# Patient Record
Sex: Female | Born: 1965
Health system: Southern US, Community
[De-identification: ages and names within clinical notes are randomized; demographics above are authoritative.]

## PROBLEM LIST (undated history)

## (undated) DIAGNOSIS — I10 Essential (primary) hypertension: Secondary | ICD-10-CM

## (undated) HISTORY — PX: TUBAL LIGATION: SHX77

---

## 1997-03-21 ENCOUNTER — Ambulatory Visit (HOSPITAL_COMMUNITY): Admission: RE | Admit: 1997-03-21 | Discharge: 1997-03-21 | Payer: Self-pay | Admitting: Obstetrics and Gynecology

## 1999-01-15 ENCOUNTER — Ambulatory Visit (HOSPITAL_COMMUNITY): Admission: RE | Admit: 1999-01-15 | Discharge: 1999-01-15 | Payer: Self-pay | Admitting: Obstetrics and Gynecology

## 2001-01-03 ENCOUNTER — Other Ambulatory Visit: Admission: RE | Admit: 2001-01-03 | Discharge: 2001-01-03 | Payer: Self-pay | Admitting: Obstetrics and Gynecology

## 2002-12-28 ENCOUNTER — Encounter: Admission: RE | Admit: 2002-12-28 | Discharge: 2002-12-28 | Payer: Self-pay | Admitting: *Deleted

## 2004-03-12 ENCOUNTER — Encounter (INDEPENDENT_AMBULATORY_CARE_PROVIDER_SITE_OTHER): Payer: Self-pay | Admitting: Specialist

## 2004-03-12 ENCOUNTER — Ambulatory Visit (HOSPITAL_BASED_OUTPATIENT_CLINIC_OR_DEPARTMENT_OTHER): Admission: RE | Admit: 2004-03-12 | Discharge: 2004-03-12 | Payer: Self-pay | Admitting: Obstetrics and Gynecology

## 2004-03-12 ENCOUNTER — Ambulatory Visit (HOSPITAL_COMMUNITY): Admission: RE | Admit: 2004-03-12 | Discharge: 2004-03-12 | Payer: Self-pay | Admitting: Obstetrics and Gynecology

## 2006-06-04 ENCOUNTER — Emergency Department (HOSPITAL_COMMUNITY): Admission: EM | Admit: 2006-06-04 | Discharge: 2006-06-04 | Payer: Self-pay | Admitting: Emergency Medicine

## 2008-08-29 ENCOUNTER — Emergency Department (HOSPITAL_BASED_OUTPATIENT_CLINIC_OR_DEPARTMENT_OTHER): Admission: EM | Admit: 2008-08-29 | Discharge: 2008-08-30 | Payer: Self-pay | Admitting: Emergency Medicine

## 2008-08-29 ENCOUNTER — Ambulatory Visit: Payer: Self-pay | Admitting: Diagnostic Radiology

## 2010-05-03 LAB — PROTEIN, CSF: Total  Protein, CSF: 33 mg/dL (ref 15–45)

## 2010-05-03 LAB — CSF CULTURE W GRAM STAIN
Culture: NO GROWTH
Gram Stain: NONE SEEN

## 2010-05-03 LAB — CBC
HCT: 38.8 % (ref 36.0–46.0)
Hemoglobin: 13.5 g/dL (ref 12.0–15.0)
MCHC: 34.7 g/dL (ref 30.0–36.0)
MCV: 97.4 fL (ref 78.0–100.0)
Platelets: 222 10*3/uL (ref 150–400)
RBC: 3.98 MIL/uL (ref 3.87–5.11)
RDW: 12.2 % (ref 11.5–15.5)
WBC: 6.4 10*3/uL (ref 4.0–10.5)

## 2010-05-03 LAB — GRAM STAIN: Gram Stain: NONE SEEN

## 2010-05-03 LAB — CSF CELL COUNT WITH DIFFERENTIAL
RBC Count, CSF: 0 /mm3
RBC Count, CSF: 1010 /mm3 — ABNORMAL HIGH
Tube #: 1
Tube #: 4
WBC, CSF: 2 /mm3 (ref 0–5)
WBC, CSF: 2 /mm3 (ref 0–5)

## 2010-05-03 LAB — COMPREHENSIVE METABOLIC PANEL
ALT: 22 U/L (ref 0–35)
AST: 22 U/L (ref 0–37)
Albumin: 4.2 g/dL (ref 3.5–5.2)
Alkaline Phosphatase: 48 U/L (ref 39–117)
BUN: 11 mg/dL (ref 6–23)
CO2: 26 mEq/L (ref 19–32)
Calcium: 9.9 mg/dL (ref 8.4–10.5)
Chloride: 105 mEq/L (ref 96–112)
Creatinine, Ser: 0.7 mg/dL (ref 0.4–1.2)
GFR calc Af Amer: >60 mL/min (ref >60)
GFR calc non Af Amer: >60 mL/min (ref >60)
Glucose, Bld: 81 mg/dL (ref 70–99)
Potassium: 3.8 mEq/L (ref 3.5–5.1)
Sodium: 142 mEq/L (ref 135–145)
Total Bilirubin: 0.2 mg/dL — ABNORMAL LOW (ref 0.3–1.2)
Total Protein: 7.1 g/dL (ref 6.0–8.3)

## 2010-05-03 LAB — DIFFERENTIAL
Basophils Absolute: 0 10*3/uL (ref 0.0–0.1)
Basophils Relative: 0 % (ref 0–1)
Eosinophils Absolute: 0.1 10*3/uL (ref 0.0–0.7)
Eosinophils Relative: 2 % (ref 0–5)
Lymphocytes Relative: 44 % (ref 12–46)
Lymphs Abs: 2.8 10*3/uL (ref 0.7–4.0)
Monocytes Absolute: 0.6 10*3/uL (ref 0.1–1.0)
Monocytes Relative: 9 % (ref 3–12)
Neutro Abs: 2.9 10*3/uL (ref 1.7–7.7)
Neutrophils Relative %: 45 % (ref 43–77)

## 2010-05-03 LAB — GLUCOSE, CSF: Glucose, CSF: 51 mg/dL (ref 43–76)

## 2010-06-13 NOTE — Op Note (Signed)
Debbie Bradshaw, Debbie Bradshaw            ACCOUNT NO.:  [id number]   MEDICAL RECORD NO.:  [id number]          PATIENT TYPE:  AMB   LOCATION:  [facility]                         FACILITY:  [facility]   PHYSICIAN:  Sherry A. Dickstein, M.D.DATE OF BIRTH:  07/05/1965   DATE OF PROCEDURE:  03/12/2004  DATE OF DISCHARGE:                                 OPERATIVE REPORT   PREOPERATIVE DIAGNOSES:  Menorrhagia, fibroid uterus.   POSTOPERATIVE DIAGNOSES:  Menorrhagia, fibroid uterus.   PROCEDURE:  Dilatation and curettage, hysteroscopy with hydrotherm  ablation.   SURGEON:  Sherry A. Rosalio Macadamia, M.D.   ANESTHESIA:  General.   INDICATIONS FOR PROCEDURE:  This is a 45 year old G-1, P-1, 0, 0, I woman  who has menstrual periods every month, lasting for five days, with  excessively heavy flow.  The patient has had known fibroids in the past.  The patient had a D&C and hysteroscopy with a resectoscope in 1999, which  improved her heavy bleeding significantly.  Since that time the bleeding has  gotten continuously worse, until this time it is back to where it was.  The  patient has had an ultrasound and a sono-hysterogram which revealed no  significant submucosal fibroid.  Because of this the patient is brought to  the operating room for a D&C, hysteroscopy with hydrotherm ablation, trying  to avoid a hysterectomy.   FINDINGS:  Approximately eight to nine weeks' size anteflexed uterus.  No  adnexal mass.  No significant submucosal fibroid seen.   DESCRIPTION OF PROCEDURE:  The patient is brought into the operating room  and given adequate general anesthesia.  She is placed in the dorsal  lithotomy position.  Her perineum and vagina were washed with Hibiclens.  The patient is draped in a sterile fashion. The speculum was placed within  the vagina.  A paracervical block was administered with 1% Nesacaine.  The  anterior lip of the cervix was grasped with a single-tooth tenaculum.  The  cervix was sounded and  dilated to approximately a #19.  It was felt that the  cervix was adequately dilated to allow a hydrotherm ablation hysteroscope  into the endometrial cavity.  This was placed without difficulty.  Pictures  were obtained.  Both cornu's were visualized.  There was some endometrial  tissue present, but no fibroids seen.  The scope was removed back to the  level of the internal os and the hydrotherm ablation sequence was begun,  including the heating of the fluid, and then a 10-minute fluid irrigation  within the endometrial cavity for therapy.  There was approximately one  minute of cool-down.  Pictures were obtained after the procedure.  The  hysteroscope was removed from the endometrial cavity using a sharp curet.  A  curettage was performed circumferentially with endometrial tissue removed in  sheets.  Adequate hemostasis was present.  All instruments were removed from  the vagina.  The patient was taken out of the dorsal lithotomy position.  She was awakened.  She was moved from the operating room table to a  stretcher, in stable condition.   COMPLICATIONS:  None.   ESTIMATED BLOOD  LOSS:  Was less than 5 mL.   SPECIMENS:  Endometrial curettings.      SAD/MEDQ  D:  03/12/2004  T:  03/12/2004  Job:  [id number]

## 2011-06-30 ENCOUNTER — Encounter (HOSPITAL_BASED_OUTPATIENT_CLINIC_OR_DEPARTMENT_OTHER): Payer: Self-pay | Admitting: *Deleted

## 2011-06-30 ENCOUNTER — Emergency Department (HOSPITAL_BASED_OUTPATIENT_CLINIC_OR_DEPARTMENT_OTHER)
Admission: EM | Admit: 2011-06-30 | Discharge: 2011-06-30 | Disposition: A | Discharge status: Home / Self Care | Payer: 59 | Attending: Emergency Medicine | Admitting: Emergency Medicine

## 2011-06-30 DIAGNOSIS — R51 Headache: Secondary | ICD-10-CM | POA: Insufficient documentation

## 2011-06-30 DIAGNOSIS — R519 Headache, unspecified: Secondary | ICD-10-CM

## 2011-06-30 HISTORY — DX: Essential (primary) hypertension: I10

## 2011-06-30 MED ORDER — OXYCODONE-ACETAMINOPHEN 5-325 MG PO TABS: 2.0000 | ORAL_TABLET | ORAL | Status: AC | PRN

## 2011-06-30 NOTE — ED Notes (Signed)
Pt dx bells palsy x 2 weeks ago, pt states increased in facial pan , completetd  acyclovir and prednisone.

## 2011-06-30 NOTE — ED Provider Notes (Signed)
History     CSN: [id number]  Arrival date & time 06/30/11  1908   First MD Initiated Contact with Patient 06/30/11 1930      Chief Complaint  Patient presents with  . Facial Pain    (Consider location/radiation/quality/duration/timing/severity/associated sxs/prior treatment) HPI Comments: Pt is c/o left sided facial pain near the ear that has been going on for several days:pt states that she was diagnosed with bells palsy and she is finished the steroids which helped a little, but she feels like she needs something more for pain:pt was given hydrocodone yesterday by her pcp, but states that she needs something more  The history is provided by the patient. No language interpreter was used.    Past Medical History  Diagnosis Date  . Hypertension     Past Surgical History  Procedure Date  . Cesarean section   . Tubal ligation     History reviewed. No pertinent family history.  History  Substance Use Topics  . Smoking status: Current Everyday Smoker -- 1.0 packs/day  . Smokeless tobacco: Not on file  . Alcohol Use: No    OB History    Grav Para Term Preterm Abortions TAB SAB Ect Mult Living                  Review of Systems  Constitutional: Negative.   Respiratory: Negative.   Cardiovascular: Negative.     Allergies  Review of patient's allergies indicates no known allergies.  Home Medications   Current Outpatient Rx  Name Route Sig Dispense Refill  . ACYCLOVIR 400 MG PO TABS Oral Take 400 mg by mouth every 4 (four) hours while awake.    Marland Kitchen HYDROCODONE-ACETAMINOPHEN 5-325 MG PO TABS Oral Take 1 tablet by mouth every 6 (six) hours as needed.    Marland Kitchen LISINOPRIL-HYDROCHLOROTHIAZIDE 20-25 MG PO TABS Oral Take 1 tablet by mouth daily.    Marland Kitchen METOPROLOL SUCCINATE ER 100 MG PO TB24 Oral Take 100 mg by mouth daily. Take with or immediately following a meal.      BP 126/76  Pulse 75  Temp(Src) 97.9 F (36.6 C) (Oral)  Resp 16  Ht 5\' 5"  (1.651 m)  Wt 219 lb (99.338  kg)  BMI 36.44 kg/m2  SpO2 100%  Physical Exam  Nursing note and vitals reviewed. Constitutional: She is oriented to person, place, and time. She appears well-developed and well-nourished.  HENT:  Right Ear: External ear normal.  Left Ear: External ear normal.       Pt has a left side facial droop  Cardiovascular: Normal rate and regular rhythm.   Pulmonary/Chest: Effort normal and breath sounds normal.  Neurological: She is alert and oriented to person, place, and time. She exhibits abnormal muscle tone. Coordination normal.    ED Course  Procedures (including critical care time)  Labs Reviewed - No data to display No results found.   1. Facial pain       MDM  Will treat pt symptomatically with percocet:no sign of tooth or dental pain        Teressa Lower, NP 06/30/11 1957

## 2011-07-01 NOTE — ED Provider Notes (Signed)
Medical screening examination/treatment/procedure(s) were performed by non-physician practitioner and as supervising physician I was immediately available for consultation/collaboration.   Tildon Silveria L Zenia Guest, MD 07/01/11 0218 

## 2012-11-09 ENCOUNTER — Encounter (HOSPITAL_COMMUNITY): Payer: Self-pay | Admitting: Emergency Medicine

## 2012-11-09 ENCOUNTER — Emergency Department (HOSPITAL_COMMUNITY)
Admission: EM | Admit: 2012-11-09 | Discharge: 2012-11-09 | Discharge status: Against Medical Advice | Payer: 59 | Attending: Emergency Medicine | Admitting: Emergency Medicine

## 2012-11-09 DIAGNOSIS — R51 Headache: Secondary | ICD-10-CM | POA: Insufficient documentation

## 2012-11-09 DIAGNOSIS — Z79899 Other long term (current) drug therapy: Secondary | ICD-10-CM | POA: Insufficient documentation

## 2012-11-09 DIAGNOSIS — I10 Essential (primary) hypertension: Secondary | ICD-10-CM | POA: Insufficient documentation

## 2012-11-09 DIAGNOSIS — F17200 Nicotine dependence, unspecified, uncomplicated: Secondary | ICD-10-CM | POA: Insufficient documentation

## 2012-11-09 DIAGNOSIS — Z8669 Personal history of other diseases of the nervous system and sense organs: Secondary | ICD-10-CM | POA: Insufficient documentation

## 2012-11-09 NOTE — ED Notes (Signed)
Headache since 1800 today.  Hx of migraine headaches.  She has not taken any med for her headache

## 2012-11-09 NOTE — ED Notes (Signed)
Pt called with no answer

## 2012-11-09 NOTE — ED Notes (Signed)
Pt called with no Answer

## 2015-09-10 DIAGNOSIS — L918 Other hypertrophic disorders of the skin: Secondary | ICD-10-CM | POA: Diagnosis not present

## 2015-10-15 DIAGNOSIS — Z6833 Body mass index (BMI) 33.0-33.9, adult: Secondary | ICD-10-CM | POA: Diagnosis not present

## 2015-10-15 DIAGNOSIS — F17200 Nicotine dependence, unspecified, uncomplicated: Secondary | ICD-10-CM | POA: Diagnosis not present

## 2015-10-15 DIAGNOSIS — Z130 Encounter for screening for diseases of the blood and blood-forming organs and certain disorders involving the immune mechanism: Secondary | ICD-10-CM | POA: Diagnosis not present

## 2015-10-15 DIAGNOSIS — Z1389 Encounter for screening for other disorder: Secondary | ICD-10-CM | POA: Diagnosis not present

## 2015-10-15 DIAGNOSIS — Z01419 Encounter for gynecological examination (general) (routine) without abnormal findings: Secondary | ICD-10-CM | POA: Diagnosis not present

## 2015-10-15 DIAGNOSIS — E8941 Symptomatic postprocedural ovarian failure: Secondary | ICD-10-CM | POA: Diagnosis not present

## 2015-10-22 DIAGNOSIS — I10 Essential (primary) hypertension: Secondary | ICD-10-CM | POA: Diagnosis not present

## 2015-10-22 DIAGNOSIS — Z0000 Encounter for general adult medical examination without abnormal findings: Secondary | ICD-10-CM | POA: Diagnosis not present

## 2015-10-22 DIAGNOSIS — N390 Urinary tract infection, site not specified: Secondary | ICD-10-CM | POA: Diagnosis not present

## 2015-10-22 DIAGNOSIS — R8299 Other abnormal findings in urine: Secondary | ICD-10-CM | POA: Diagnosis not present

## 2015-10-29 DIAGNOSIS — Z1389 Encounter for screening for other disorder: Secondary | ICD-10-CM | POA: Diagnosis not present

## 2015-10-29 DIAGNOSIS — Z780 Asymptomatic menopausal state: Secondary | ICD-10-CM | POA: Diagnosis not present

## 2015-10-29 DIAGNOSIS — E611 Iron deficiency: Secondary | ICD-10-CM | POA: Diagnosis not present

## 2015-10-29 DIAGNOSIS — Z0000 Encounter for general adult medical examination without abnormal findings: Secondary | ICD-10-CM | POA: Diagnosis not present

## 2015-10-29 DIAGNOSIS — I10 Essential (primary) hypertension: Secondary | ICD-10-CM | POA: Diagnosis not present

## 2015-10-29 DIAGNOSIS — F418 Other specified anxiety disorders: Secondary | ICD-10-CM | POA: Diagnosis not present

## 2015-12-11 DIAGNOSIS — Z6837 Body mass index (BMI) 37.0-37.9, adult: Secondary | ICD-10-CM | POA: Diagnosis not present

## 2015-12-11 DIAGNOSIS — H6692 Otitis media, unspecified, left ear: Secondary | ICD-10-CM | POA: Diagnosis not present

## 2015-12-24 DIAGNOSIS — S339XXA Sprain of unspecified parts of lumbar spine and pelvis, initial encounter: Secondary | ICD-10-CM | POA: Diagnosis not present

## 2015-12-26 DIAGNOSIS — Z23 Encounter for immunization: Secondary | ICD-10-CM | POA: Diagnosis not present

## 2015-12-26 DIAGNOSIS — M5416 Radiculopathy, lumbar region: Secondary | ICD-10-CM | POA: Diagnosis not present

## 2015-12-26 DIAGNOSIS — Z6838 Body mass index (BMI) 38.0-38.9, adult: Secondary | ICD-10-CM | POA: Diagnosis not present

## 2016-01-01 ENCOUNTER — Other Ambulatory Visit: Payer: Self-pay | Admitting: Internal Medicine

## 2016-01-01 DIAGNOSIS — M5416 Radiculopathy, lumbar region: Secondary | ICD-10-CM

## 2016-01-08 ENCOUNTER — Other Ambulatory Visit: Payer: Self-pay | Admitting: Internal Medicine

## 2016-01-21 ENCOUNTER — Ambulatory Visit
Admission: RE | Admit: 2016-01-21 | Discharge: 2016-01-21 | Disposition: A | Discharge status: Home / Self Care | Payer: BLUE CROSS/BLUE SHIELD | Source: Ambulatory Visit | Attending: Internal Medicine | Admitting: Internal Medicine

## 2016-01-21 DIAGNOSIS — M5416 Radiculopathy, lumbar region: Secondary | ICD-10-CM

## 2016-01-21 DIAGNOSIS — M5126 Other intervertebral disc displacement, lumbar region: Secondary | ICD-10-CM | POA: Diagnosis not present

## 2016-01-27 HISTORY — PX: SHOULDER ARTHROSCOPY: SHX128

## 2016-02-18 DIAGNOSIS — M4726 Other spondylosis with radiculopathy, lumbar region: Secondary | ICD-10-CM | POA: Diagnosis not present

## 2016-02-18 DIAGNOSIS — I10 Essential (primary) hypertension: Secondary | ICD-10-CM | POA: Diagnosis not present

## 2016-02-18 DIAGNOSIS — Z6832 Body mass index (BMI) 32.0-32.9, adult: Secondary | ICD-10-CM | POA: Diagnosis not present

## 2016-03-04 ENCOUNTER — Ambulatory Visit (INDEPENDENT_AMBULATORY_CARE_PROVIDER_SITE_OTHER): Payer: Self-pay

## 2016-03-04 ENCOUNTER — Ambulatory Visit (INDEPENDENT_AMBULATORY_CARE_PROVIDER_SITE_OTHER): Payer: BLUE CROSS/BLUE SHIELD | Admitting: Orthopedic Surgery

## 2016-03-04 ENCOUNTER — Encounter (INDEPENDENT_AMBULATORY_CARE_PROVIDER_SITE_OTHER): Payer: Self-pay | Admitting: Orthopedic Surgery

## 2016-03-04 VITALS — BP 128/58 | HR 78 | Resp 13 | Ht 65.0 in | Wt 198.0 lb

## 2016-03-04 DIAGNOSIS — M542 Cervicalgia: Secondary | ICD-10-CM | POA: Diagnosis not present

## 2016-03-04 DIAGNOSIS — M25512 Pain in left shoulder: Secondary | ICD-10-CM

## 2016-03-04 DIAGNOSIS — M25511 Pain in right shoulder: Secondary | ICD-10-CM

## 2016-03-04 DIAGNOSIS — G8929 Other chronic pain: Secondary | ICD-10-CM | POA: Diagnosis not present

## 2016-03-04 MED ORDER — LIDOCAINE HCL 1 % IJ SOLN
2.0000 mL | INTRAMUSCULAR | Status: AC | PRN
  Administered 2016-03-04: 2 mL

## 2016-03-04 MED ORDER — BUPIVACAINE HCL 0.5 % IJ SOLN
2.0000 mL | INTRAMUSCULAR | Status: AC | PRN
  Administered 2016-03-04: 2 mL via INTRA_ARTICULAR

## 2016-03-04 MED ORDER — METHYLPREDNISOLONE ACETATE 40 MG/ML IJ SUSP
80.0000 mg | INTRAMUSCULAR | Status: AC | PRN
  Administered 2016-03-04: 80 mg

## 2016-03-04 MED ORDER — HYDROCODONE-ACETAMINOPHEN 5-325 MG PO TABS: 1.0000 | ORAL_TABLET | Freq: Four times a day (QID) | ORAL | 0 refills | Status: DC | PRN

## 2016-03-04 NOTE — Progress Notes (Signed)
Office Visit Note   Patient: Debbie Bradshaw           Date of Birth: 1965-12-06           MRN: [id number] Visit Date: 03/04/2016              Requested by: No referring provider defined for this encounter. PCP: [facility]   Assessment & Plan: Visit Diagnoses:  1. Chronic right shoulder pain   2. Chronic left shoulder pain   3. Neck pain     Plan:  #1: Corticosteroid injection to the left shoulder intra-and extra-articular subacromial #2: Physical therapy for the cervical spine #3: If the injection is not beneficial she can call and we can get an MRI of her right shoulder  Follow-Up Instructions: Return if symptoms worsen or fail to improve.   Orders:  Orders Placed This Encounter  Procedures  . Large Joint Injection/Arthrocentesis  . XR Shoulder Right  . XR Shoulder Left  . XR Cervical Spine 2 or 3 views  . Ambulatory referral to Physical Therapy   Meds ordered this encounter  Medications  . HYDROcodone-acetaminophen (NORCO/VICODIN) 5-325 MG tablet    Sig: Take 1 tablet by mouth every 6 (six) hours as needed for moderate pain.    Dispense:  30 tablet    Refill:  0    Order Specific Question:   Supervising Provider    Answer:   Valeria Batman [[id number]]      Procedures: Large Joint Inj Date/Time: 03/04/2016 10:20 AM Performed by: Jacqualine Code D Authorized by: Jacqualine Code D   Consent Given by:  Patient Timeout: prior to procedure the correct patient, procedure, and site was verified   Indications:  Pain Location:  Shoulder Site:  L glenohumeral Prep: patient was prepped and draped in usual sterile fashion   Needle Size:  25 G Needle Length:  1.5 inches Approach:  Posterior Ultrasound Guidance: No   Fluoroscopic Guidance: No   Arthrogram: No   Medications:  2 mL lidocaine 1 %; 2 mL bupivacaine 0.5 %; 80 mg methylPREDNISolone acetate 40 MG/ML Aspiration Attempted: No   Patient tolerance:  Patient tolerated the procedure well  with no immediate complications     Clinical Data: No additional findings.   Subjective: Chief Complaint  Patient presents with  . Left Shoulder - Pain  . 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 today    Review of Systems   Objective: Vital Signs: BP (!) 128/58 (BP Location: Right Arm, Patient Position: Sitting, Cuff Size: Normal)   Pulse 78   Resp 13   Ht 5\' 5"  (1.651 m)   Wt 198 lb (89.8 kg)   BMI 32.95 kg/m   Physical Exam  Back Exam   Comments:  Cervical spine reveals no symptoms into the left arm except for left rotation which causes her pain and discomfort into the left arm. Reflexes are decreased in the left triceps and brachial radialis. Right. The right biceps triceps and brachial radialis is very brisk. She does have good strength with testing the biceps triceps   Right Shoulder Exam   Range of Motion  Active  Abduction: 150  Passive Abduction: 150  Forward Flexion: 150  External Rotation: 80  Internal Rotation 90 degrees: 60   Muscle Strength  Abduction: 3/5  Internal Rotation: 3/5  External Rotation: 4/5  Supraspinatus: 4/5   Tests  Impingement: positive  Other  Sensation: normal Pulse: present   Left Shoulder Exam   Range of Motion  Active Abduction: 170  Passive Abduction: 170  Forward Flexion: 180  External Rotation: 90  Internal Rotation 90 degrees: 90   Muscle Strength  Abduction: 4/5  Internal Rotation: 4/5  External Rotation: 4/5  Supraspinatus: 4/5   Other  Sensation: normal Pulse: present       Specialty Comments:  No specialty comments available.  Imaging: Xr Cervical Spine 2 Or 3  Views  Result Date: 03/04/2016 Two-view cervical spine reveals straightening of lordotic curve. She may have symptoms C7-T1 degenerative disc disease noted. She otherwise has good maintenance of disc spaces.  Xr Shoulder Left  Result Date: 03/04/2016 Left shoulder 2 views reveals a type I acromion. No other pathology.  Xr Shoulder Right  Result Date: 03/04/2016 Two-view right shoulder reveals elevation of the humeral head. She does have possibly some changes in the subacromial portion of the acromion which may be because of the high riding humeral head. Appears to be a type I acromion    PMFS History: There are no active problems to display for this patient.  Past Medical History:  Diagnosis Date  . Hypertension     Family History  Problem Relation Age of Onset  . Heart attack Mother   . Cancer Father   . Crohn's disease Father   . Osteoporosis Father   . Diabetes Father     Past Surgical History:  Procedure Laterality Date  . CESAREAN SECTION    . TUBAL LIGATION     Social History   Occupational History  . Not on file.   Social History Main Topics  . Smoking status: Current Every Day Smoker    Packs/day: 0.50    Years: 35.00    Types: Cigarettes  . Smokeless tobacco: Never Used  . Alcohol use No  . Drug use: No  . Sexual activity: Yes    Birth control/ protection: Surgical

## 2016-03-09 ENCOUNTER — Telehealth (INDEPENDENT_AMBULATORY_CARE_PROVIDER_SITE_OTHER): Payer: Self-pay | Admitting: Orthopaedic Surgery

## 2016-03-09 DIAGNOSIS — M542 Cervicalgia: Secondary | ICD-10-CM | POA: Diagnosis not present

## 2016-03-09 NOTE — Telephone Encounter (Signed)
Patient states she saw Arlys JohnBrian last week and had an injection in her right shoulder and she has not gotten any relief and wanted to proceed with the next step of an MRI. Please advise.

## 2016-03-10 ENCOUNTER — Telehealth (INDEPENDENT_AMBULATORY_CARE_PROVIDER_SITE_OTHER): Payer: Self-pay | Admitting: Orthopaedic Surgery

## 2016-03-10 ENCOUNTER — Other Ambulatory Visit (INDEPENDENT_AMBULATORY_CARE_PROVIDER_SITE_OTHER): Payer: Self-pay

## 2016-03-10 DIAGNOSIS — M25511 Pain in right shoulder: Principal | ICD-10-CM

## 2016-03-10 DIAGNOSIS — G8929 Other chronic pain: Secondary | ICD-10-CM

## 2016-03-10 NOTE — Telephone Encounter (Signed)
Patient called requesting a refill of her hydrocodone.  Cb#[phone].  Thank you

## 2016-03-10 NOTE — Telephone Encounter (Signed)
Please advise 

## 2016-03-10 NOTE — Telephone Encounter (Signed)
Sent referral per BP

## 2016-03-10 NOTE — Telephone Encounter (Signed)
Just had  hydrocodone last week-needs to wait

## 2016-03-11 NOTE — Telephone Encounter (Signed)
done

## 2016-03-12 DIAGNOSIS — M5416 Radiculopathy, lumbar region: Secondary | ICD-10-CM | POA: Diagnosis not present

## 2016-03-12 DIAGNOSIS — M4726 Other spondylosis with radiculopathy, lumbar region: Secondary | ICD-10-CM | POA: Diagnosis not present

## 2016-03-13 ENCOUNTER — Telehealth (INDEPENDENT_AMBULATORY_CARE_PROVIDER_SITE_OTHER): Payer: Self-pay | Admitting: Orthopaedic Surgery

## 2016-03-13 DIAGNOSIS — M542 Cervicalgia: Secondary | ICD-10-CM | POA: Diagnosis not present

## 2016-03-13 NOTE — Telephone Encounter (Signed)
Patient is scheduled for shoulder MRI  Feb 24th @[facility] and is requesting something as she is claustrophobic.  She has taken Xanax in the past when having an MRI.  Also requesting a script for Hydrocodone.  Please call when ready.

## 2016-03-16 ENCOUNTER — Other Ambulatory Visit (INDEPENDENT_AMBULATORY_CARE_PROVIDER_SITE_OTHER): Payer: Self-pay

## 2016-03-16 DIAGNOSIS — M542 Cervicalgia: Secondary | ICD-10-CM | POA: Diagnosis not present

## 2016-03-16 MED ORDER — ALPRAZOLAM 0.25 MG PO TABS: 0.2500 mg | ORAL_TABLET | Freq: Two times a day (BID) | ORAL | 0 refills | Status: AC | PRN

## 2016-03-16 NOTE — Telephone Encounter (Signed)
See message.

## 2016-03-16 NOTE — Telephone Encounter (Signed)
Spoke with pt and [place] faxed to pharmacy

## 2016-03-16 NOTE — Telephone Encounter (Signed)
OK for xanax-hold pain meds until after MRI

## 2016-03-18 ENCOUNTER — Telehealth (INDEPENDENT_AMBULATORY_CARE_PROVIDER_SITE_OTHER): Payer: Self-pay | Admitting: Orthopaedic Surgery

## 2016-03-18 NOTE — Telephone Encounter (Signed)
Patient is requesting something for pain. [facility] [address]

## 2016-03-18 NOTE — Telephone Encounter (Signed)
On 2/16, 2018, pt requested meds and I called her and told her PW said no meds until after MRI

## 2016-03-19 ENCOUNTER — Telehealth (INDEPENDENT_AMBULATORY_CARE_PROVIDER_SITE_OTHER): Payer: Self-pay | Admitting: Orthopaedic Surgery

## 2016-03-19 NOTE — Telephone Encounter (Signed)
Called to make sure the [facility] [address]  Cb#[phone].  Thank you

## 2016-03-19 NOTE — Telephone Encounter (Signed)
We can give her an rx on Monday.  She can use the Tramadol that she had before we gave her the hydrocodone

## 2016-03-19 NOTE — Telephone Encounter (Signed)
This is the third message she had sent wanting pain meds. I called her 2 x and did send in her xanax for the MRI. PW said she is not to receive any meds until her MRI.

## 2016-03-19 NOTE — Telephone Encounter (Signed)
Patient would like to speak to Marylu LundJanet or Arlys JohnBrian about needing something for pain. I advised patient of the message Marylu LundJanet had left per Dr. Cleophas DunkerWhitfield, saying no meds until after MRI. Patient states she is hurting very badly and cannot wait until then so she is requesting to speak to someone. She is also following up about the xanax she would like to get for the actual MRI. Thanks.

## 2016-03-19 NOTE — Telephone Encounter (Signed)
CALLED

## 2016-03-20 DIAGNOSIS — M542 Cervicalgia: Secondary | ICD-10-CM | POA: Diagnosis not present

## 2016-03-20 NOTE — Telephone Encounter (Signed)
I called the pt yesterday and told her it was sent x 2 phone calls

## 2016-03-20 NOTE — Telephone Encounter (Signed)
Patient called to see if she could get Xanax for her MRI tomorrow.  She needs this because of being claustrophobic and will not be able to complete the MRI.  Cb#[phone].

## 2016-03-21 ENCOUNTER — Ambulatory Visit
Admission: RE | Admit: 2016-03-21 | Discharge: 2016-03-21 | Disposition: A | Discharge status: Home / Self Care | Payer: BLUE CROSS/BLUE SHIELD | Source: Ambulatory Visit | Attending: Orthopaedic Surgery | Admitting: Orthopaedic Surgery

## 2016-03-21 DIAGNOSIS — M25511 Pain in right shoulder: Principal | ICD-10-CM

## 2016-03-21 DIAGNOSIS — G8929 Other chronic pain: Secondary | ICD-10-CM

## 2016-03-24 ENCOUNTER — Encounter (INDEPENDENT_AMBULATORY_CARE_PROVIDER_SITE_OTHER): Payer: Self-pay | Admitting: Orthopaedic Surgery

## 2016-03-24 ENCOUNTER — Ambulatory Visit (INDEPENDENT_AMBULATORY_CARE_PROVIDER_SITE_OTHER): Payer: BLUE CROSS/BLUE SHIELD | Admitting: Orthopaedic Surgery

## 2016-03-24 VITALS — BP 149/88 | HR 77 | Resp 14 | Ht 65.0 in | Wt 187.0 lb

## 2016-03-24 DIAGNOSIS — G8929 Other chronic pain: Secondary | ICD-10-CM | POA: Diagnosis not present

## 2016-03-24 DIAGNOSIS — M25511 Pain in right shoulder: Secondary | ICD-10-CM | POA: Diagnosis not present

## 2016-03-24 MED ORDER — HYDROCODONE-ACETAMINOPHEN 5-325 MG PO TABS: 1.0000 | ORAL_TABLET | Freq: Two times a day (BID) | ORAL | 0 refills | Status: DC | PRN

## 2016-03-24 NOTE — Progress Notes (Signed)
   Office Visit Note   Patient: Debbie Bradshaw           Date of Birth: Nov 06, 1965           MRN: [id number] Visit Date: 03/24/2016              [facility]ed [facility] [address] [place], [place] [id number] [facility]   Assessment & Plan: Visit Diagnoses: Impingement syndrome right shoulder with rotator cuff tendinitis  Plan: Long discussion regarding treatment options based on the MRI scan (see below). We will [facility] [place]. Discussed  surgical arthroscopic debridement mod as an option . We will follow up as needed. Prescription for hydrocodone  with instructions on use over 2 months.  Follow-Up Instructions: No Follow-up on file.   Orders:  No orders of the defined types were placed in this encounter.  No orders of the defined types were placed in this encounter.     Procedures: No procedures performed   Clinical Data: No additional findings.   Subjective: No chief complaint on file. No change in right shoulder symptoms as previously identified. Mrs. Debbie PlaterWallace does have difficulty with overhead activities and sleeping. She has not responded to subacromial cortisone injection.  MRI results today   An MRI scan of the right shoulder performed 03/21/2016. It reveals moderately severe to severe appearing rotator cuff tendinopathy worse in the supraspinatus. No tear identified. No muscle atrophy or local lesion. Biceps with tendinosis but no tear. Moderate osteoarthritis at the acromioclavicular joint with a type I acromion. Glenohumeral joint negative. Labrum intact. Subacromial and subdeltoid bursitis Review of Systems   Objective: Vital Signs: There were no vitals taken for this visit.  Physical Exam  Ortho Exam mild tenderness over the acromioclavicular joint right shoulder. Mild impingement. Skin intact. No loss of overhead motion.  Specialty Comments:  No specialty comments  available.  Imaging: No results found.   PMFS History: There are no active problems to display for this patient.  Past Medical History:  Diagnosis Date  . Hypertension     Family History  Problem Relation Age of Onset  . Heart attack Mother   . Cancer Father   . Crohn's disease Father   . Osteoporosis Father   . Diabetes Father     Past Surgical History:  Procedure Laterality Date  . CESAREAN SECTION    . TUBAL LIGATION     Social History   Occupational History  . Not on file.   Social History Main Topics  . Smoking status: Current Every Day Smoker    Packs/day: 0.50    Years: 35.00    Types: Cigarettes  . Smokeless tobacco: Never Used  . Alcohol use No  . Drug use: No  . Sexual activity: Yes    Birth control/ protection: Surgical

## 2016-03-24 NOTE — Addendum Note (Signed)
Addended by: Mare LoanLAWSON, Eulalio Reamy M on: 03/24/2016 04:11 PM   Modules accepted: Orders

## 2016-03-30 DIAGNOSIS — M542 Cervicalgia: Secondary | ICD-10-CM | POA: Diagnosis not present

## 2016-04-03 DIAGNOSIS — M542 Cervicalgia: Secondary | ICD-10-CM | POA: Diagnosis not present

## 2016-04-09 DIAGNOSIS — M5416 Radiculopathy, lumbar region: Secondary | ICD-10-CM | POA: Diagnosis not present

## 2016-04-09 DIAGNOSIS — M4726 Other spondylosis with radiculopathy, lumbar region: Secondary | ICD-10-CM | POA: Diagnosis not present

## 2016-04-10 DIAGNOSIS — M542 Cervicalgia: Secondary | ICD-10-CM | POA: Diagnosis not present

## 2016-04-13 DIAGNOSIS — M542 Cervicalgia: Secondary | ICD-10-CM | POA: Diagnosis not present

## 2016-04-17 DIAGNOSIS — M542 Cervicalgia: Secondary | ICD-10-CM | POA: Diagnosis not present

## 2016-04-20 ENCOUNTER — Telehealth (INDEPENDENT_AMBULATORY_CARE_PROVIDER_SITE_OTHER): Payer: Self-pay | Admitting: Orthopaedic Surgery

## 2016-04-20 ENCOUNTER — Other Ambulatory Visit (INDEPENDENT_AMBULATORY_CARE_PROVIDER_SITE_OTHER): Payer: Self-pay

## 2016-04-20 MED ORDER — HYDROCODONE-ACETAMINOPHEN 5-325 MG PO TABS: 1.0000 | ORAL_TABLET | Freq: Two times a day (BID) | ORAL | 0 refills | Status: DC | PRN

## 2016-04-20 NOTE — Telephone Encounter (Signed)
[facility] and said [facility] had received a phone call trying to refill a prescription on hydrocodone which she said cannot be called in.

## 2016-04-20 NOTE — Telephone Encounter (Signed)
OK 

## 2016-04-20 NOTE — Telephone Encounter (Signed)
Tried to call pt and busy signal.

## 2016-04-20 NOTE — Telephone Encounter (Signed)
rx at front desk, called pt

## 2016-04-20 NOTE — Telephone Encounter (Signed)
Patient is requesting Rx Hydrocodone for the R shoulder.  Please call when available for pick up.

## 2016-04-20 NOTE — Telephone Encounter (Signed)
Patient called requesting a refill on her pain medication Hydrocodone.  [facility] [address]  CB#[phone].  Thank You.

## 2016-04-20 NOTE — Telephone Encounter (Signed)
OK for 20-please call pt and tell her that the rx is for a limited number of pills and they need to last for 1 month

## 2016-05-01 DIAGNOSIS — M542 Cervicalgia: Secondary | ICD-10-CM | POA: Diagnosis not present

## 2016-05-04 ENCOUNTER — Other Ambulatory Visit (INDEPENDENT_AMBULATORY_CARE_PROVIDER_SITE_OTHER): Payer: Self-pay | Admitting: Orthopaedic Surgery

## 2016-05-04 NOTE — Telephone Encounter (Signed)
Needs return office visit  

## 2016-05-04 NOTE — Telephone Encounter (Signed)
Please advise 

## 2016-05-04 NOTE — Telephone Encounter (Signed)
Called pt and she will make a new appt

## 2016-05-04 NOTE — Telephone Encounter (Signed)
Patient called the morning requesting a refill on her hydrocodone.  CB#[phone].  Thank you.

## 2016-05-05 ENCOUNTER — Ambulatory Visit (INDEPENDENT_AMBULATORY_CARE_PROVIDER_SITE_OTHER): Payer: BLUE CROSS/BLUE SHIELD | Admitting: Orthopaedic Surgery

## 2016-05-05 ENCOUNTER — Encounter (INDEPENDENT_AMBULATORY_CARE_PROVIDER_SITE_OTHER): Payer: Self-pay | Admitting: Orthopaedic Surgery

## 2016-05-05 VITALS — BP 147/77 | HR 72 | Resp 14 | Ht 66.0 in | Wt 187.0 lb

## 2016-05-05 DIAGNOSIS — M7581 Other shoulder lesions, right shoulder: Secondary | ICD-10-CM

## 2016-05-05 DIAGNOSIS — M19011 Primary osteoarthritis, right shoulder: Secondary | ICD-10-CM

## 2016-05-05 DIAGNOSIS — M7541 Impingement syndrome of right shoulder: Secondary | ICD-10-CM

## 2016-05-05 DIAGNOSIS — M542 Cervicalgia: Secondary | ICD-10-CM | POA: Diagnosis not present

## 2016-05-05 NOTE — Progress Notes (Signed)
Office Visit Note   Patient: Debbie Bradshaw           Date of Birth: 1965/07/07           MRN: [id number] Visit Date: 05/05/2016              Requested by: [facility] [address] [place], [place] [address] PCP: [facility]   Assessment & Plan: Visit Diagnoses:  1. Impingement syndrome of right shoulder   2. Osteoarthritis of right acromioclavicular joint   3. Rotator cuff tendonitis, right     Plan:  #1: At this time our plan is to proceed with an arthroscopic subacromial decompression with distal clavicle excision and possible rotator cuff repair. She would like to wait until the flower season is over which would be the end of May and then consider surgery. She will call Benna Dunks  She already has hydrocodone for pain and she can continue to use that until she plants her surgery.  Follow-Up Instructions: No Follow-up on file.   Orders:  No orders of the defined types were placed in this encounter.  No orders of the defined types were placed in this encounter.     Procedures: No procedures performed   Clinical Data: No additional findings.   Subjective: Chief Complaint  Patient presents with  . 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 Worse with landscaping and unfortunately has progressively worsened with her pain and discomfort. She did have an MRI which revealed moderately severe to severe appearing rotator cuff tendinopathy worse in the supraspinatus. No tear identified. No muscle atrophy or local lesion. Biceps with tendinosis but no tear. Moderate osteoarthritis at the acromioclavicular joint with a type I acromion. Glenohumeral joint negative. Labrum intact. Subacromial and subdeltoid bursitis  She has done physical therapy which is had some benefit. However she  is continued to have pain and discomfort in the shoulder and actually is needing narcotics at nighttime to sleep. 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 She comes in today for reevaluation.  Review of Systems  Constitutional: Negative.   HENT: Negative.   Respiratory: Negative.   Cardiovascular: Negative.   Gastrointestinal: Negative.   Genitourinary: Negative.   Skin: Negative.   Neurological: Negative.   Hematological: Negative.   Psychiatric/Behavioral: Negative.      Objective: Vital Signs: BP (!) 147/77   Pulse 72   Resp 14   Ht  (1.676 m)   Wt 187 lb (84.8 kg)   BMI 30.18 kg/m   Physical Exam  Constitutional: She is oriented to person, place, and time. She appears well-developed and well-nourished.  HENT:  Head: Normocephalic and atraumatic.  Eyes: EOM are normal. Pupils are equal, round, and reactive to light.  Pulmonary/Chest: Effort normal.  Neurological: She is alert and oriented to person, place, and time.  Skin: Skin is warm and dry.  Psychiatric: She has a normal mood and affect. Her behavior is normal. Judgment and thought content normal.    Ortho Exam   Today she has near full range of motion passively however the final 30-40 of both abduction and forward flexion causes significant pain and discomfort. She has good strength at  this time. But certainly has a painful arc. Some tenderness over the before meals joint.  Specialty Comments:  No specialty comments available.  Imaging: An MRI scan of the right shoulder performed 03/21/2016. It reveals moderately severe to severe appearing rotator cuff tendinopathy worse in the supraspinatus. No tear identified. No muscle atrophy or local lesion. Biceps with tendinosis but no tear. Moderate osteoarthritis at the acromioclavicular joint with a type I acromion. Glenohumeral joint negative. Labrum intact. Subacromial and subdeltoid  bursitis   PMFS History: There are no active problems to display for this patient.  Past Medical History:  Diagnosis Date  . Hypertension     Family History  Problem Relation Age of Onset  . Heart attack Mother   . Cancer Father   . Crohn's disease Father   . Osteoporosis Father   . Diabetes Father     Past Surgical History:  Procedure Laterality Date  . CESAREAN SECTION    . TUBAL LIGATION     Social History   Occupational History  . Not on file.   Social History Main Topics  . Smoking status: Current Every Day Smoker    Packs/day: 0.50    Years: 35.00    Types: Cigarettes  . Smokeless tobacco: Never Used  . Alcohol use No  . Drug use: No  . Sexual activity: Yes    Birth control/ protection: Surgical

## 2016-05-08 DIAGNOSIS — M542 Cervicalgia: Secondary | ICD-10-CM | POA: Diagnosis not present

## 2016-05-11 ENCOUNTER — Telehealth (INDEPENDENT_AMBULATORY_CARE_PROVIDER_SITE_OTHER): Payer: Self-pay | Admitting: Orthopaedic Surgery

## 2016-05-11 NOTE — Telephone Encounter (Signed)
Patient called requesting a refill of her Hydrocodone.  CB#[phone].  Thank you.

## 2016-05-11 NOTE — Telephone Encounter (Signed)
No  pain med refills until surgery scheduled

## 2016-05-11 NOTE — Telephone Encounter (Signed)
Please advise 

## 2016-05-13 DIAGNOSIS — I10 Essential (primary) hypertension: Secondary | ICD-10-CM | POA: Diagnosis not present

## 2016-05-13 DIAGNOSIS — Z6838 Body mass index (BMI) 38.0-38.9, adult: Secondary | ICD-10-CM | POA: Diagnosis not present

## 2016-05-13 DIAGNOSIS — M4726 Other spondylosis with radiculopathy, lumbar region: Secondary | ICD-10-CM | POA: Diagnosis not present

## 2016-05-13 NOTE — Telephone Encounter (Signed)
called

## 2016-05-15 ENCOUNTER — Other Ambulatory Visit (INDEPENDENT_AMBULATORY_CARE_PROVIDER_SITE_OTHER): Payer: Self-pay

## 2016-05-15 MED ORDER — HYDROCODONE-ACETAMINOPHEN 5-325 MG PO TABS: ORAL_TABLET | ORAL | 0 refills | Status: DC

## 2016-05-15 NOTE — Telephone Encounter (Signed)
Please see date of surgery

## 2016-05-15 NOTE — Telephone Encounter (Signed)
Patient request a refill on Hydrocodone. [facility] [address]. Patient also states she has scheduled rt shoulder surgery on June 7,2018.

## 2016-05-15 NOTE — Telephone Encounter (Signed)
Ok to renew hydrocodone 5/325 #30 1 tab po once a day as needed. No refills prior to surgery

## 2016-05-18 NOTE — Telephone Encounter (Signed)
Done

## 2016-07-02 DIAGNOSIS — M19011 Primary osteoarthritis, right shoulder: Secondary | ICD-10-CM | POA: Diagnosis not present

## 2016-07-02 DIAGNOSIS — M7541 Impingement syndrome of right shoulder: Secondary | ICD-10-CM | POA: Diagnosis not present

## 2016-07-02 DIAGNOSIS — M75101 Unspecified rotator cuff tear or rupture of right shoulder, not specified as traumatic: Secondary | ICD-10-CM | POA: Diagnosis not present

## 2016-07-02 DIAGNOSIS — G8918 Other acute postprocedural pain: Secondary | ICD-10-CM | POA: Diagnosis not present

## 2016-07-06 ENCOUNTER — Encounter (INDEPENDENT_AMBULATORY_CARE_PROVIDER_SITE_OTHER): Payer: Self-pay | Admitting: Orthopaedic Surgery

## 2016-07-06 ENCOUNTER — Other Ambulatory Visit (INDEPENDENT_AMBULATORY_CARE_PROVIDER_SITE_OTHER): Payer: Self-pay

## 2016-07-06 ENCOUNTER — Telehealth (INDEPENDENT_AMBULATORY_CARE_PROVIDER_SITE_OTHER): Payer: Self-pay

## 2016-07-06 ENCOUNTER — Ambulatory Visit (INDEPENDENT_AMBULATORY_CARE_PROVIDER_SITE_OTHER): Payer: BLUE CROSS/BLUE SHIELD | Admitting: Orthopaedic Surgery

## 2016-07-06 VITALS — BP 126/73 | HR 70 | Resp 12 | Ht 66.0 in | Wt 187.0 lb

## 2016-07-06 DIAGNOSIS — M25511 Pain in right shoulder: Secondary | ICD-10-CM

## 2016-07-06 DIAGNOSIS — G8929 Other chronic pain: Secondary | ICD-10-CM

## 2016-07-06 NOTE — Progress Notes (Signed)
   Office Visit Note   Patient: Debbie MinorsKimberly A Micco           Date of Birth: 1965-08-28           MRN: [id number] Visit Date: 07/06/2016              Requested by: Daiva NakayamaPa, [facility] [address] [place], [place] [id number] PCP: Daiva NakayamaPa, [facility]   Assessment & Plan: Visit Diagnoses:  1. Chronic right shoulder pain   4 days status post rotator cuff tear repair right shoulder associated with an arthroscopic subacromial decompression and distal clavicle resection-doing well  Plan: Dressing change, new Steri-Strips, peak and physical [facility]  [place], office 2 weeks. May shower. Instructions on using sling and circumduction exercises.  Follow-Up Instructions: Return in about 2 weeks (around 07/20/2016).   Orders:  No orders of the defined types were placed in this encounter.  No orders of the defined types were placed in this encounter.     Procedures: No procedures performed   Clinical Data: No additional findings.   Subjective: 4 days status post arthroscopic subacromial decompression and distal clavicle resection with a mini open rotator cuff tear repair. Doing well  HPI  Review of Systems   Objective: Vital Signs: BP 126/73   Pulse 70   Resp 12   Ht 5\' 6"  (1.676 m)   Wt 187 lb (84.8 kg)   BMI 30.18 kg/m   Physical Exam  Ortho Exam right shoulder wounds healing without evidence of infection. New Steri-Strips applied. Neurovascular exam intact  Specialty Comments:  No specialty comments available.  Imaging: No results found.   PMFS History: There are no active problems to display for this patient.  Past Medical History:  Diagnosis Date  . Hypertension     Family History  Problem Relation Age of Onset  . Heart attack Mother   . Cancer Father   . Crohn's disease Father   . Osteoporosis Father   . Diabetes Father     Past Surgical History:  Procedure Laterality Date  . CESAREAN SECTION     . SHOULDER ARTHROSCOPY  2018  . TUBAL LIGATION     Social History   Occupational History  . Not on file.   Social History Main Topics  . Smoking status: Current Every Day Smoker    Packs/day: 0.50    Years: 35.00    Types: Cigarettes  . Smokeless tobacco: Never Used  . Alcohol use No  . Drug use: No  . Sexual activity: Yes    Birth control/ protection: Surgical     Valeria BatmanPeter W Whitfield, MD   Note - This record has been created using [employer] software.  Chart creation errors have been sought, but may not always  have been located. Such creation errors do not reflect on  the standard of medical care.

## 2016-07-07 NOTE — Telephone Encounter (Signed)
none

## 2016-07-09 ENCOUNTER — Encounter (INDEPENDENT_AMBULATORY_CARE_PROVIDER_SITE_OTHER): Payer: Self-pay

## 2016-07-09 ENCOUNTER — Other Ambulatory Visit (INDEPENDENT_AMBULATORY_CARE_PROVIDER_SITE_OTHER): Payer: Self-pay

## 2016-07-09 ENCOUNTER — Telehealth (INDEPENDENT_AMBULATORY_CARE_PROVIDER_SITE_OTHER): Payer: Self-pay | Admitting: Orthopaedic Surgery

## 2016-07-09 MED ORDER — OXYCODONE-ACETAMINOPHEN 5-325 MG PO TABS: 1.0000 | ORAL_TABLET | Freq: Two times a day (BID) | ORAL | 0 refills | Status: DC | PRN

## 2016-07-09 MED ORDER — HYDROCODONE-ACETAMINOPHEN 5-325 MG PO TABS: ORAL_TABLET | ORAL | 0 refills | Status: DC

## 2016-07-09 NOTE — Telephone Encounter (Signed)
Patient called requesting a note to return to work on light duty starting Monday, June 18th and she also would like a prescription for pain medication for when she starts PT.  CB#[phone].  Thank you.

## 2016-07-09 NOTE — Telephone Encounter (Signed)
Ok to do

## 2016-07-09 NOTE — Telephone Encounter (Signed)
OK to do-

## 2016-07-14 DIAGNOSIS — M25511 Pain in right shoulder: Secondary | ICD-10-CM | POA: Diagnosis not present

## 2016-07-14 DIAGNOSIS — M25611 Stiffness of right shoulder, not elsewhere classified: Secondary | ICD-10-CM | POA: Diagnosis not present

## 2016-07-16 ENCOUNTER — Telehealth (INDEPENDENT_AMBULATORY_CARE_PROVIDER_SITE_OTHER): Payer: Self-pay | Admitting: Orthopaedic Surgery

## 2016-07-16 NOTE — Telephone Encounter (Signed)
JENNY @ [facility] CALLED, REQUESTING OP NOTE & POST OP NOTE.Marland Kitchen. FAXED TO [phone].

## 2016-07-17 DIAGNOSIS — M25511 Pain in right shoulder: Secondary | ICD-10-CM | POA: Diagnosis not present

## 2016-07-17 DIAGNOSIS — M25611 Stiffness of right shoulder, not elsewhere classified: Secondary | ICD-10-CM | POA: Diagnosis not present

## 2016-07-20 ENCOUNTER — Encounter (INDEPENDENT_AMBULATORY_CARE_PROVIDER_SITE_OTHER): Payer: Self-pay | Admitting: Orthopaedic Surgery

## 2016-07-20 ENCOUNTER — Ambulatory Visit (INDEPENDENT_AMBULATORY_CARE_PROVIDER_SITE_OTHER): Payer: BLUE CROSS/BLUE SHIELD | Admitting: Orthopaedic Surgery

## 2016-07-20 VITALS — BP 128/73 | HR 69 | Resp 14 | Ht 66.0 in | Wt 187.0 lb

## 2016-07-20 DIAGNOSIS — M7541 Impingement syndrome of right shoulder: Secondary | ICD-10-CM

## 2016-07-20 MED ORDER — TRAMADOL HCL 50 MG PO TABS: ORAL_TABLET | ORAL | 0 refills | Status: DC

## 2016-07-20 NOTE — Progress Notes (Signed)
   Office Visit Note   Patient: Debbie MinorsKimberly A Mortimer           Date of Birth: Mar 20, 1965           MRN: [id number] Visit Date: 07/20/2016              Requested by: Daiva NakayamaPa, [facility] [address] [place] [id number] PCP: Daiva NakayamaPa, [facility]   Assessment & Plan: Visit Diagnoses:  1. Impingement syndrome of right shoulder    3 weeks post right shoulder surgery with RCT rrepair Plan: Continue sling, continue physical therapy, office 2 weeks  Follow-Up Instructions: No Follow-up on file.   Orders:  No orders of the defined types were placed in this encounter.  No orders of the defined types were placed in this encounter.     Procedures: No procedures performed   Clinical Data: No additional findings.   Subjective: No chief complaint on file. 3 weeks post rotator cuff repair right shoulder. Still having some pain with abduction at about 60 and flexion the same in the routine along the anterior subacromial region. Wounds healing nicely without evidence of infection. Neurovascular exam intact  HPI  Review of Systems   Objective: Vital Signs: There were no vitals taken for this visit.  Physical Exam  Ortho Exam incisions of healed nicely without evidence of infection neurovascular exam intact. Subacromial pain anteriorly with abduction and flexion about 60.  Specialty Comments:  No specialty comments available.  Imaging: No results found.   PMFS History: There are no active problems to display for this patient.  Past Medical History:  Diagnosis Date  . Hypertension     Family History  Problem Relation Age of Onset  . Heart attack Mother   . Cancer Father   . Crohn's disease Father   . Osteoporosis Father   . Diabetes Father     Past Surgical History:  Procedure Laterality Date  . CESAREAN SECTION    . SHOULDER ARTHROSCOPY  2018  . TUBAL LIGATION     Social History   Occupational History  . Not on file.   Social  History Main Topics  . Smoking status: Current Every Day Smoker    Packs/day: 0.50    Years: 35.00    Types: Cigarettes  . Smokeless tobacco: Never Used  . Alcohol use No  . Drug use: No  . Sexual activity: Yes    Birth control/ protection: Surgical     Valeria BatmanPeter W Anaise Sterbenz, MD   Note - This record has been created using [employer] software.  Chart creation errors have been sought, but may not always  have been located. Such creation errors do not reflect on  the standard of medical care.

## 2016-07-21 DIAGNOSIS — M25611 Stiffness of right shoulder, not elsewhere classified: Secondary | ICD-10-CM | POA: Diagnosis not present

## 2016-07-21 DIAGNOSIS — M25511 Pain in right shoulder: Secondary | ICD-10-CM | POA: Diagnosis not present

## 2016-07-23 ENCOUNTER — Telehealth: Payer: Self-pay

## 2016-07-23 NOTE — Telephone Encounter (Signed)
Patient would like a Rx for Hydrocodone for right shoulder.  Cb# [phone].  Please advise.

## 2016-07-23 NOTE — Telephone Encounter (Signed)
Please advise 

## 2016-07-23 NOTE — Telephone Encounter (Signed)
We can try tramadol if she likes

## 2016-07-24 ENCOUNTER — Telehealth (INDEPENDENT_AMBULATORY_CARE_PROVIDER_SITE_OTHER): Payer: Self-pay | Admitting: Orthopaedic Surgery

## 2016-07-24 ENCOUNTER — Other Ambulatory Visit (INDEPENDENT_AMBULATORY_CARE_PROVIDER_SITE_OTHER): Payer: Self-pay | Admitting: Orthopedic Surgery

## 2016-07-24 DIAGNOSIS — M25511 Pain in right shoulder: Secondary | ICD-10-CM | POA: Diagnosis not present

## 2016-07-24 DIAGNOSIS — M25611 Stiffness of right shoulder, not elsewhere classified: Secondary | ICD-10-CM | POA: Diagnosis not present

## 2016-07-24 NOTE — Telephone Encounter (Signed)
She also stated that she is not taking them, she wants the hydrocodone for night time so she can sleep

## 2016-07-24 NOTE — Telephone Encounter (Signed)
Patient is okay with taking Tramadol.  CB#[phone].  Thank you.

## 2016-07-24 NOTE — Telephone Encounter (Signed)
SHE RECEIVED 30 ON 6/25...PLEASE CHECK IF SHE RECEIVED THE RX.... IF NOT, WE WILL HAVE TO SEND ONE IN

## 2016-07-24 NOTE — Telephone Encounter (Signed)
Patient stated that she did receive the prescription on 6/25.  She is wanting Hydrocodone.  CB#[phone].  Thank you.

## 2016-07-27 ENCOUNTER — Telehealth (INDEPENDENT_AMBULATORY_CARE_PROVIDER_SITE_OTHER): Payer: Self-pay | Admitting: Orthopaedic Surgery

## 2016-07-27 DIAGNOSIS — M25611 Stiffness of right shoulder, not elsewhere classified: Secondary | ICD-10-CM | POA: Diagnosis not present

## 2016-07-27 DIAGNOSIS — M25511 Pain in right shoulder: Secondary | ICD-10-CM | POA: Diagnosis not present

## 2016-07-27 NOTE — Telephone Encounter (Signed)
Patient called this morning to check on a request for pain medication from Friday.  She did get the prescription that was sent on 6/25, but states that she needed something for a night to help her sleep.  She is taking the ones that she picked up but does not help her at night to sleep. CB#[phone]

## 2016-07-27 NOTE — Telephone Encounter (Signed)
LM with husband that I can have the rx on Tuesday.

## 2016-07-28 ENCOUNTER — Other Ambulatory Visit (INDEPENDENT_AMBULATORY_CARE_PROVIDER_SITE_OTHER): Payer: Self-pay

## 2016-07-28 DIAGNOSIS — M25511 Pain in right shoulder: Secondary | ICD-10-CM | POA: Diagnosis not present

## 2016-07-28 DIAGNOSIS — M25611 Stiffness of right shoulder, not elsewhere classified: Secondary | ICD-10-CM | POA: Diagnosis not present

## 2016-07-28 DIAGNOSIS — M7541 Impingement syndrome of right shoulder: Secondary | ICD-10-CM

## 2016-07-28 MED ORDER — TRAMADOL HCL 50 MG PO TABS: ORAL_TABLET | ORAL | 0 refills | Status: DC

## 2016-07-28 NOTE — Telephone Encounter (Signed)
Called 3 times  Ist time husband answered and apparently hung up.Marland Kitchen. Next 21 to voice mail

## 2016-07-28 NOTE — Telephone Encounter (Signed)
Please look at the last time we gave her tramadol. It looks like 6/25/we gave her #30.

## 2016-07-30 NOTE — Telephone Encounter (Signed)
spoke with pt on Tuesday and left rx at front desk

## 2016-08-04 DIAGNOSIS — M25611 Stiffness of right shoulder, not elsewhere classified: Secondary | ICD-10-CM | POA: Diagnosis not present

## 2016-08-04 DIAGNOSIS — M25511 Pain in right shoulder: Secondary | ICD-10-CM | POA: Diagnosis not present

## 2016-08-06 DIAGNOSIS — M25511 Pain in right shoulder: Secondary | ICD-10-CM | POA: Diagnosis not present

## 2016-08-06 DIAGNOSIS — M25611 Stiffness of right shoulder, not elsewhere classified: Secondary | ICD-10-CM | POA: Diagnosis not present

## 2016-08-07 ENCOUNTER — Encounter (INDEPENDENT_AMBULATORY_CARE_PROVIDER_SITE_OTHER): Payer: Self-pay | Admitting: Orthopaedic Surgery

## 2016-08-07 ENCOUNTER — Ambulatory Visit (INDEPENDENT_AMBULATORY_CARE_PROVIDER_SITE_OTHER): Payer: BLUE CROSS/BLUE SHIELD | Admitting: Orthopaedic Surgery

## 2016-08-07 VITALS — BP 116/72 | HR 14 | Ht 68.0 in | Wt 187.0 lb

## 2016-08-07 DIAGNOSIS — G8929 Other chronic pain: Secondary | ICD-10-CM

## 2016-08-07 DIAGNOSIS — M25511 Pain in right shoulder: Secondary | ICD-10-CM

## 2016-08-07 NOTE — Progress Notes (Signed)
   Post-Op Visit Note   Patient: Debbie MinorsKimberly A Bradshaw           Date of Birth: 09/09/1965           MRN: [id number] Visit Date: 08/07/2016 PCP: Daiva NakayamaPa, [facility]   Assessment & Plan:  Chief Complaint:  Chief Complaint  Patient presents with  . Right Shoulder - Routine Post Op    [name] is 5 weeks status post R shoulder arthroscopy. She relates her pain is getting better but hurts at night after working. She does wear the sling most of the time.   Visit Diagnoses:  1. Chronic right shoulder pain   Five-week status post rotator cuff tear repair right shoulder and doing well. This being followed in physical therapy and continues to sling.  Plan: Sling with one more week. Continue physical therapy. Office 1 month. Out of the sling I was able to passively abduct over 100 and flex to at least 120. Wounds are healed very nicely without evidence of infection. She no longer has pain she had preoperatively  Follow-Up Instructions: Return in about 1 month (around 09/07/2016).   Orders:  No orders of the defined types were placed in this encounter.  No orders of the defined types were placed in this encounter.   Imaging: No results found.  PMFS History: There are no active problems to display for this patient.  Past Medical History:  Diagnosis Date  . Hypertension     Family History  Problem Relation Age of Onset  . Heart attack Mother   . Cancer Father   . Crohn's disease Father   . Osteoporosis Father   . Diabetes Father     Past Surgical History:  Procedure Laterality Date  . CESAREAN SECTION    . SHOULDER ARTHROSCOPY  2018  . TUBAL LIGATION     Social History   Occupational History  . Not on file.   Social History Main Topics  . Smoking status: Current Every Day Smoker    Packs/day: 0.50    Years: 35.00    Types: Cigarettes  . Smokeless tobacco: Never Used  . Alcohol use No  . Drug use: No  . Sexual activity: Yes    Birth control/  protection: Surgical

## 2016-08-11 DIAGNOSIS — M25611 Stiffness of right shoulder, not elsewhere classified: Secondary | ICD-10-CM | POA: Diagnosis not present

## 2016-08-11 DIAGNOSIS — M25511 Pain in right shoulder: Secondary | ICD-10-CM | POA: Diagnosis not present

## 2016-08-13 DIAGNOSIS — M25611 Stiffness of right shoulder, not elsewhere classified: Secondary | ICD-10-CM | POA: Diagnosis not present

## 2016-08-13 DIAGNOSIS — M25511 Pain in right shoulder: Secondary | ICD-10-CM | POA: Diagnosis not present

## 2016-08-18 DIAGNOSIS — M25511 Pain in right shoulder: Secondary | ICD-10-CM | POA: Diagnosis not present

## 2016-08-18 DIAGNOSIS — M25611 Stiffness of right shoulder, not elsewhere classified: Secondary | ICD-10-CM | POA: Diagnosis not present

## 2016-08-20 DIAGNOSIS — M25511 Pain in right shoulder: Secondary | ICD-10-CM | POA: Diagnosis not present

## 2016-08-20 DIAGNOSIS — M25611 Stiffness of right shoulder, not elsewhere classified: Secondary | ICD-10-CM | POA: Diagnosis not present

## 2016-08-24 ENCOUNTER — Telehealth (INDEPENDENT_AMBULATORY_CARE_PROVIDER_SITE_OTHER): Payer: Self-pay | Admitting: Orthopaedic Surgery

## 2016-08-24 ENCOUNTER — Other Ambulatory Visit (INDEPENDENT_AMBULATORY_CARE_PROVIDER_SITE_OTHER): Payer: Self-pay

## 2016-08-24 DIAGNOSIS — M7541 Impingement syndrome of right shoulder: Secondary | ICD-10-CM

## 2016-08-24 MED ORDER — TRAMADOL HCL 50 MG PO TABS: ORAL_TABLET | ORAL | 0 refills | Status: DC

## 2016-08-24 NOTE — Telephone Encounter (Signed)
Patient request rx for Tramadol, or Hydrocodone. Whichever she can get. Please call patient to advise. [facility] [address].

## 2016-08-24 NOTE — Telephone Encounter (Signed)
faxed

## 2016-08-24 NOTE — Telephone Encounter (Signed)
Tramadol 50 mg #30 1-2 tabs po bid prn

## 2016-08-24 NOTE — Telephone Encounter (Signed)
Please advise 

## 2016-08-27 DIAGNOSIS — M25611 Stiffness of right shoulder, not elsewhere classified: Secondary | ICD-10-CM | POA: Diagnosis not present

## 2016-08-27 DIAGNOSIS — M25511 Pain in right shoulder: Secondary | ICD-10-CM | POA: Diagnosis not present

## 2016-09-03 DIAGNOSIS — M25611 Stiffness of right shoulder, not elsewhere classified: Secondary | ICD-10-CM | POA: Diagnosis not present

## 2016-09-03 DIAGNOSIS — M25511 Pain in right shoulder: Secondary | ICD-10-CM | POA: Diagnosis not present

## 2016-09-07 ENCOUNTER — Ambulatory Visit (INDEPENDENT_AMBULATORY_CARE_PROVIDER_SITE_OTHER): Payer: BLUE CROSS/BLUE SHIELD | Admitting: Orthopaedic Surgery

## 2016-09-07 ENCOUNTER — Encounter (INDEPENDENT_AMBULATORY_CARE_PROVIDER_SITE_OTHER): Payer: Self-pay | Admitting: Orthopaedic Surgery

## 2016-09-07 VITALS — Ht 66.0 in | Wt 187.0 lb

## 2016-09-07 DIAGNOSIS — M25511 Pain in right shoulder: Secondary | ICD-10-CM | POA: Diagnosis not present

## 2016-09-07 DIAGNOSIS — G8929 Other chronic pain: Secondary | ICD-10-CM

## 2016-09-07 MED ORDER — METHYLPREDNISOLONE ACETATE 40 MG/ML IJ SUSP
80.0000 mg | INTRAMUSCULAR | Status: AC | PRN
  Administered 2016-09-07: 80 mg

## 2016-09-07 MED ORDER — BUPIVACAINE HCL 0.5 % IJ SOLN
2.0000 mL | INTRAMUSCULAR | Status: AC | PRN
  Administered 2016-09-07: 2 mL via INTRA_ARTICULAR

## 2016-09-07 MED ORDER — LIDOCAINE HCL 1 % IJ SOLN
2.0000 mL | INTRAMUSCULAR | Status: AC | PRN
  Administered 2016-09-07: 2 mL

## 2016-09-07 NOTE — Progress Notes (Signed)
Office Visit Note   Patient: Debbie Bradshaw           Date of Birth: [birth year]           MRN: [id number] Visit Date: 09/07/2016              [facility]ed by: [facility] [address] [place], [place] [address] PCP: [facility]   Assessment & Plan: Visit Diagnoses:  1. Chronic right shoulder pain   9 weeks status post rotator cuff tear repair through a mini open incision right shoulder. Doing well in physical therapy but does have an occasional pop and click beneath the subacromial region that interferes with her activities.  Plan: Subacromial cortisone injection right shoulder, continue with exercises, office 1 month. Not sure of the origin of the click and hopefully it's just something that will resolve with time and a cortisone injection.  Follow-Up Instructions: Return in about 1 month (around 10/08/2016).   Orders:  No orders of the defined types were placed in this encounter.  No orders of the defined types were placed in this encounter.     Procedures: Large Joint Inj Date/Time: 09/07/2016 4:22 PM Performed by: Debbie Bradshaw Authorized by: Debbie Bradshaw   Consent Given by:  Patient Timeout: prior to procedure the correct patient, procedure, and site was verified   Indications:  Pain Location:  Shoulder Site:  L subacromial bursa Prep: patient was prepped and draped in usual sterile fashion   Needle Size:  25 G Needle Length:  1.5 inches Approach:  Lateral Ultrasound Guidance: No   Fluoroscopic Guidance: No   Arthrogram: No   Medications:  80 mg methylPREDNISolone acetate 40 MG/ML; 2 mL lidocaine 1 %; 2 mL bupivacaine 0.5 % Aspiration Attempted: No   Patient tolerance:  Patient tolerated the procedure well with no immediate complications     Clinical Data: No additional findings.   Subjective: Chief Complaint  Patient presents with  . Right Shoulder - Routine Post Op    Pt is 9 weeks status post R  shoulder arthroscopy. She relates that PT says she has impingement  Syndrome. It's hard to move when in lying on her back  9 weeks status post arthroscopic subacromial decompression, distal clavicle resection and mini open rotator cuff tear repair. Debbie Bradshaw has been very active in her postoperative course returning to work in a sling he has. She's been going to physical therapy and has developed a click beneath her acromium. This will on occasion and if her with her activities. No numbness or tingling.  HPI  Review of Systems   Objective: Vital Signs: Ht 5\' 6"  (1.676 m)   Wt 187 lb (84.8 kg)   BMI 30.18 kg/m   Physical Exam  Ortho Exam right shoulder with obvious click on certain motions particularly with overhead activity with internal/external rotation. No loss of motion. Neurovascular exam intact. Incisions of healed nicely.  Specialty Comments:  No specialty comments available.  Imaging: No results found.   PMFS History: There are no active problems to display for this patient.  Past Medical History:  Diagnosis Date  . Hypertension     Family History  Problem Relation Age of Onset  . Heart attack Mother   . Cancer Father   . Crohn's disease Father   . Osteoporosis Father   . Diabetes Father     Past Surgical History:  Procedure Laterality Date  . CESAREAN SECTION    . SHOULDER  ARTHROSCOPY  2018  . TUBAL LIGATION     Social History   Occupational History  . Not on file.   Social History Main Topics  . Smoking status: Current Every Day Smoker    Packs/day: 0.50    Years: 35.00    Types: Cigarettes  . Smokeless tobacco: Never Used  . Alcohol use No  . Drug use: No  . Sexual activity: Yes    Birth control/ protection: Surgical     Debbie BatmanPeter W Waino Mounsey, MD   Note - This record has been created using [employer] software.  Chart creation errors have been sought, but may not always  have been located. Such creation errors do not reflect on  the standard  of medical care.

## 2016-09-09 ENCOUNTER — Other Ambulatory Visit (INDEPENDENT_AMBULATORY_CARE_PROVIDER_SITE_OTHER): Payer: Self-pay

## 2016-09-10 ENCOUNTER — Telehealth (INDEPENDENT_AMBULATORY_CARE_PROVIDER_SITE_OTHER): Payer: Self-pay | Admitting: Orthopaedic Surgery

## 2016-09-10 DIAGNOSIS — M25611 Stiffness of right shoulder, not elsewhere classified: Secondary | ICD-10-CM | POA: Diagnosis not present

## 2016-09-10 DIAGNOSIS — M25511 Pain in right shoulder: Secondary | ICD-10-CM | POA: Diagnosis not present

## 2016-09-10 NOTE — Telephone Encounter (Signed)
Please advise 

## 2016-09-10 NOTE — Telephone Encounter (Signed)
Patients rt shoulder still popping with physical therapy. Injection did not help. Patient would like to discuss MRI of shoulder. Please call to advise.

## 2016-09-17 DIAGNOSIS — M25611 Stiffness of right shoulder, not elsewhere classified: Secondary | ICD-10-CM | POA: Diagnosis not present

## 2016-09-17 DIAGNOSIS — M25511 Pain in right shoulder: Secondary | ICD-10-CM | POA: Diagnosis not present

## 2016-09-22 ENCOUNTER — Telehealth (INDEPENDENT_AMBULATORY_CARE_PROVIDER_SITE_OTHER): Payer: Self-pay | Admitting: Radiology

## 2016-09-22 ENCOUNTER — Other Ambulatory Visit (INDEPENDENT_AMBULATORY_CARE_PROVIDER_SITE_OTHER): Payer: Self-pay

## 2016-09-22 DIAGNOSIS — M7541 Impingement syndrome of right shoulder: Secondary | ICD-10-CM

## 2016-09-22 MED ORDER — TRAMADOL HCL 50 MG PO TABS: ORAL_TABLET | ORAL | 0 refills | Status: DC

## 2016-09-22 NOTE — Telephone Encounter (Signed)
Left up front

## 2016-09-22 NOTE — Telephone Encounter (Signed)
Patient requesting refill for tramadol for her shoulder pain. [facility] [address]. CB# [phone].

## 2016-09-24 DIAGNOSIS — M25611 Stiffness of right shoulder, not elsewhere classified: Secondary | ICD-10-CM | POA: Diagnosis not present

## 2016-09-24 DIAGNOSIS — M25511 Pain in right shoulder: Secondary | ICD-10-CM | POA: Diagnosis not present

## 2016-10-07 ENCOUNTER — Ambulatory Visit (INDEPENDENT_AMBULATORY_CARE_PROVIDER_SITE_OTHER): Payer: BLUE CROSS/BLUE SHIELD | Admitting: Orthopedic Surgery

## 2016-10-07 DIAGNOSIS — M25511 Pain in right shoulder: Secondary | ICD-10-CM | POA: Diagnosis not present

## 2016-10-07 DIAGNOSIS — M25611 Stiffness of right shoulder, not elsewhere classified: Secondary | ICD-10-CM | POA: Diagnosis not present

## 2016-10-08 ENCOUNTER — Encounter (INDEPENDENT_AMBULATORY_CARE_PROVIDER_SITE_OTHER): Payer: Self-pay | Admitting: Orthopedic Surgery

## 2016-10-08 ENCOUNTER — Ambulatory Visit (INDEPENDENT_AMBULATORY_CARE_PROVIDER_SITE_OTHER): Payer: BLUE CROSS/BLUE SHIELD | Admitting: Orthopedic Surgery

## 2016-10-08 VITALS — BP 137/76 | HR 78 | Resp 14 | Ht 66.0 in | Wt 175.0 lb

## 2016-10-08 DIAGNOSIS — Z9889 Other specified postprocedural states: Secondary | ICD-10-CM

## 2016-10-08 NOTE — Progress Notes (Signed)
Office Visit Note   Patient: Debbie Bradshaw           Date of Birth: 1965-08-30           MRN: [id number] Visit Date: 10/08/2016              Requested by: Daiva NakayamaPa, [facility] [address] [place] [id number] PCP: Daiva NakayamaPa, [facility]   Assessment & Plan: Visit Diagnoses:  1. S/P right rotator cuff repair     Plan:  #1: Continue her exercise program #2: Follow back up with us in 6 weeks if she still having the clicking sensation in the joint   Follow-Up Instructions: Return in about 6 weeks (around 11/19/2016).   Orders:  No orders of the defined types were placed in this encounter.  No orders of the defined types were placed in this encounter.     Procedures: No procedures performed   Clinical Data: No additional findings.   Subjective: Chief Complaint  Patient presents with  . Right Shoulder - Routine Post Op    Debbie Bradshaw is 3 months status post Right shoulder arthroscopy. She relates some night time pain.    HPI  Debbie Bradshaw is seen today for evaluation of her right shoulder. She is a 51 year old white female who is now 3 months status post right shoulder SA decompression, DCR, mini open rotator cuff repair of a complete rotator cuff tear. She states her pain is improving. She still has this palpable and audible click that is bothersome to her. She states has not really that painful. Denies any neurovascular compromise. Denies any wound problems.  Review of Systems  Constitutional: Negative for chills, fatigue and fever.  Eyes: Negative for itching.  Respiratory: Negative for chest tightness and shortness of breath.   Cardiovascular: Negative for chest pain, palpitations and leg swelling.  Gastrointestinal: Negative for blood in stool, constipation and diarrhea.  Musculoskeletal: Negative for back pain, joint swelling, neck pain and neck stiffness.  Neurological: Negative for dizziness, weakness, numbness and headaches.    Hematological: Does not bruise/bleed easily.  Psychiatric/Behavioral: Negative for sleep disturbance. The patient is not nervous/anxious.      Objective: Vital Signs: BP 137/76   Pulse 78   Resp 14   Ht 5\' 6"  (1.676 m)   Wt 175 lb (79.4 kg)   BMI 28.25 kg/m   Physical Exam  Constitutional: She is oriented to person, place, and time. She appears well-developed and well-nourished.  HENT:  Head: Normocephalic and atraumatic.  Eyes: Pupils are equal, round, and reactive to light. EOM are normal.  Pulmonary/Chest: Effort normal.  Neurological: She is alert and oriented to person, place, and time.  Skin: Skin is warm and dry.  Psychiatric: She has a normal mood and affect. Her behavior is normal. Judgment and thought content normal.    Ortho Exam  Today her wounds are well-healed. No signs of infection. She's got good motion of the shoulder but she does have this audible palpable click. I cannot locate where this is coming from but appears to be a little bit more anterior to the distal clavicle. She got good strength in all planes tested. Neurovascular intact distally.  Specialty Comments:  No specialty comments available.  Imaging: No results found.   PMFS History: There are no active problems to display for this patient.  Past Medical History:  Diagnosis Date  . Hypertension     Family History  Problem Relation Age of Onset  . Heart  attack Mother   . Cancer Father   . Crohn's disease Father   . Osteoporosis Father   . Diabetes Father     Past Surgical History:  Procedure Laterality Date  . CESAREAN SECTION    . SHOULDER ARTHROSCOPY  2018  . TUBAL LIGATION     Social History   Occupational History  . Not on file.   Social History Main Topics  . Smoking status: Current Every Day Smoker    Packs/day: 0.50    Years: 35.00    Types: Cigarettes  . Smokeless tobacco: Never Used  . Alcohol use No  . Drug use: No  . Sexual activity: Yes    Birth control/  protection: Surgical

## 2016-10-28 DIAGNOSIS — I10 Essential (primary) hypertension: Secondary | ICD-10-CM | POA: Diagnosis not present

## 2016-10-28 DIAGNOSIS — Z0000 Encounter for general adult medical examination without abnormal findings: Secondary | ICD-10-CM | POA: Diagnosis not present

## 2016-11-04 DIAGNOSIS — Z1389 Encounter for screening for other disorder: Secondary | ICD-10-CM | POA: Diagnosis not present

## 2016-11-04 DIAGNOSIS — G43909 Migraine, unspecified, not intractable, without status migrainosus: Secondary | ICD-10-CM | POA: Diagnosis not present

## 2016-11-04 DIAGNOSIS — G2581 Restless legs syndrome: Secondary | ICD-10-CM | POA: Diagnosis not present

## 2016-11-04 DIAGNOSIS — N951 Menopausal and female climacteric states: Secondary | ICD-10-CM | POA: Diagnosis not present

## 2016-11-04 DIAGNOSIS — Z0000 Encounter for general adult medical examination without abnormal findings: Secondary | ICD-10-CM | POA: Diagnosis not present

## 2016-11-04 DIAGNOSIS — Z6841 Body Mass Index (BMI) 40.0 and over, adult: Secondary | ICD-10-CM | POA: Diagnosis not present

## 2016-11-05 ENCOUNTER — Encounter (INDEPENDENT_AMBULATORY_CARE_PROVIDER_SITE_OTHER): Payer: Self-pay | Admitting: Orthopaedic Surgery

## 2016-11-05 ENCOUNTER — Ambulatory Visit (INDEPENDENT_AMBULATORY_CARE_PROVIDER_SITE_OTHER): Payer: BLUE CROSS/BLUE SHIELD | Admitting: Orthopaedic Surgery

## 2016-11-05 VITALS — BP 144/78 | HR 72 | Resp 12 | Ht 68.0 in | Wt 175.0 lb

## 2016-11-05 DIAGNOSIS — M7521 Bicipital tendinitis, right shoulder: Secondary | ICD-10-CM | POA: Insufficient documentation

## 2016-11-05 DIAGNOSIS — G8929 Other chronic pain: Secondary | ICD-10-CM | POA: Diagnosis not present

## 2016-11-05 DIAGNOSIS — M7541 Impingement syndrome of right shoulder: Secondary | ICD-10-CM | POA: Diagnosis not present

## 2016-11-05 DIAGNOSIS — M25511 Pain in right shoulder: Secondary | ICD-10-CM | POA: Diagnosis not present

## 2016-11-05 MED ORDER — LIDOCAINE HCL 1 % IJ SOLN
2.0000 mL | INTRAMUSCULAR | Status: AC | PRN
  Administered 2016-11-05: 2 mL

## 2016-11-05 MED ORDER — LIDOCAINE HCL 2 % IJ SOLN
2.0000 mL | INTRAMUSCULAR | Status: AC | PRN
  Administered 2016-11-05: 2 mL

## 2016-11-05 MED ORDER — METHYLPREDNISOLONE ACETATE 40 MG/ML IJ SUSP
40.0000 mg | INTRAMUSCULAR | Status: AC | PRN
  Administered 2016-11-05: 40 mg via INTRA_ARTICULAR

## 2016-11-05 MED ORDER — TRAMADOL HCL 50 MG PO TABS: ORAL_TABLET | ORAL | 0 refills | Status: DC

## 2016-11-05 NOTE — Progress Notes (Deleted)
   Office Visit Note   Patient: Debbie Bradshaw           Date of Birth: Jun 10, 1965           MRN: [id number] Visit Date: 11/05/2016              [facility]ed by: [facility] [address] [place], [place] [address] PCP: [facility]   Assessment & Plan: Visit Diagnoses: No diagnosis found.  Plan: ***  Follow-Up Instructions: No Follow-up on file.   Orders:  No orders of the defined types were placed in this encounter.  No orders of the defined types were placed in this encounter.     Procedures: No procedures performed   Clinical Data: No additional findings.   Subjective: Chief Complaint  Patient presents with  . Right Shoulder - Pain    [name] is a 51 y o S/P 62m,2w R shoulder arthroscopy. She is doing well.    HPI  Review of Systems  Constitutional: Negative for chills, fatigue and fever.  Eyes: Negative for itching.  Respiratory: Negative for chest tightness and shortness of breath.   Cardiovascular: Negative for chest pain, palpitations and leg swelling.  Gastrointestinal: Negative for blood in stool, constipation and diarrhea.  Endocrine: Negative for polyuria.  Genitourinary: Negative for dysuria.  Musculoskeletal: Negative for back pain, joint swelling, neck pain and neck stiffness.  Allergic/Immunologic: Negative for immunocompromised state.  Neurological: Positive for headaches. Negative for dizziness and numbness.  Hematological: Does not bruise/bleed easily.  Psychiatric/Behavioral: The patient is not nervous/anxious.      Objective: Vital Signs: BP (!) 144/78   Pulse 72   Resp 12   Ht  (1.727 m)   Wt 175 lb (79.4 kg)   BMI 26.61 kg/m   Physical Exam  Ortho Exam  Specialty Comments:  No specialty comments available.  Imaging: No results found.   PMFS History: There are no active problems to display for this patient.  Past Medical History:  Diagnosis Date  . Hypertension       Family History  Problem Relation Age of Onset  . Heart attack Mother   . Cancer Father   . Crohn's disease Father   . Osteoporosis Father   . Diabetes Father     Past Surgical History:  Procedure Laterality Date  . CESAREAN SECTION    . SHOULDER ARTHROSCOPY  2018  . TUBAL LIGATION     Social History   Occupational History  . Not on file.   Social History Main Topics  . Smoking status: Current Every Day Smoker    Packs/day: 0.50    Years: 35.00    Types: Cigarettes  . Smokeless tobacco: Never Used  . Alcohol use No  . Drug use: No  . Sexual activity: Yes    Birth control/ protection: Surgical

## 2016-11-05 NOTE — Progress Notes (Signed)
Office Visit Note   Patient: Debbie Bradshaw           Date of Birth: 07-24-1965           MRN: [id number] Visit Date: 11/05/2016              Requested by: [facility] [address] [place], [place] [address] PCP: [facility]   Assessment & Plan: Visit Diagnoses:  1. Biceps tendonitis on right   2. Chronic right shoulder pain   3. 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 #3: She'll add Voltaren gel to her regimen #4: If she continues to have symptoms at that area may need to consider possible biceps tenodesis in the future.  Follow-Up Instructions: Return if symptoms worsen or fail to improve.   Orders:  Orders Placed This Encounter  Procedures  . Large Joint Injection/Arthrocentesis   Meds ordered this encounter  Medications  . traMADol (ULTRAM) 50 MG tablet    Sig: Take 1-2 po qhs    Dispense:  30 tablet    Refill:  0    Order Specific Question:   Supervising Provider    Answer:   Valeria Batman [[id number]]      Procedures: Large Joint Inj Date/Time: 11/05/2016 9:48 AM Performed by: Jacqualine Code D Authorized by: Jacqualine Code D   Consent Given by:  Patient Site marked: the procedure site was marked   Timeout: prior to procedure the correct patient, procedure, and site was verified   Indications:  Pain (Biceps tendinitis) Location:  Shoulder Shoulder joint: Biceps tendon. Prep: patient was prepped and draped in usual sterile fashion   Needle Size:  27 G Approach:  Anterior Ultrasound Guidance: No   Fluoroscopic Guidance: No   Arthrogram: No   Medications:  2 mL lidocaine 1 %; 2 mL lidocaine 2 %; 40 mg methylPREDNISolone acetate 40 MG/ML Aspiration Attempted: No       Clinical Data: No additional  findings.   Subjective: Chief Complaint  Patient presents with  . Right Shoulder - Pain, Routine Post Op    [name] is a 51 y o S/P 20m,2w R shoulder arthroscopy. She is doing well.She has one spot that hurts in the shoulder.    HPI  Debbie Bradshaw is a very pleasant 51 year old white female who is now 4 months status post right shoulder arthroscopic debridement with distal clavicle excision and mini open rotator cuff repair. His doing well overall. However she's had this click in her shoulder and underwent a subacromial injection previously by Dr. Cleophas Dunker which did have some benefit. She is now however more painful over her biceps tendon at the bicipital groove. She denies any recent history of injury or trauma.  Review of Systems  Constitutional: Negative for chills, fatigue and fever.  Eyes: Negative for itching.  Respiratory: Negative for chest tightness and shortness of breath.   Cardiovascular: Negative for chest pain, palpitations and leg swelling.  Gastrointestinal: Negative for blood in stool, constipation and diarrhea.  Musculoskeletal: Negative for back pain, joint swelling, neck pain and neck stiffness.  Neurological: Negative for dizziness, weakness, numbness and headaches.  Hematological: Does not bruise/bleed easily.  Psychiatric/Behavioral: Negative for sleep disturbance. The patient is not nervous/anxious.      Objective: Vital Signs: BP Marland Kitchen)  144/78   Pulse 72   Resp 12   Ht  (1.727 m)   Wt 175 lb (79.4 kg)   BMI 26.61 kg/m   Physical Exam  Constitutional: She is oriented to person, place, and time. She appears well-developed and well-nourished.  HENT:  Head: Normocephalic and atraumatic.  Eyes: Pupils are equal, round, and reactive to light. EOM are normal.  Pulmonary/Chest: Effort normal.  Neurological: She is alert and oriented to person, place, and time.  Skin: Skin is warm and dry.  Psychiatric: She has a normal mood and affect. Her behavior is  normal. Judgment and thought content normal.    Ortho Exam  Today she has near full range of motion of the right shoulder. Well-healed surgical incisions. She is neurovascularly intact distally. She has tenderness palpation over the biceps tendon. Negative empty can test.   Specialty Comments:  No specialty comments available.  Imaging: No results found.   PMFS History: Patient Active Problem List   Diagnosis Date Noted  . Biceps tendonitis on right 11/05/2016   Past Medical History:  Diagnosis Date  . Hypertension     Family History  Problem Relation Age of Onset  . Heart attack Mother   . Cancer Father   . Crohn's disease Father   . Osteoporosis Father   . Diabetes Father     Past Surgical History:  Procedure Laterality Date  . CESAREAN SECTION    . SHOULDER ARTHROSCOPY  2018  . TUBAL LIGATION     Social History   Occupational History  . Not on file.   Social History Main Topics  . Smoking status: Current Every Day Smoker    Packs/day: 0.50    Years: 35.00    Types: Cigarettes  . Smokeless tobacco: Never Used  . Alcohol use No  . Drug use: No  . Sexual activity: Yes    Birth control/ protection: Surgical

## 2016-11-11 DIAGNOSIS — Z23 Encounter for immunization: Secondary | ICD-10-CM | POA: Diagnosis not present

## 2016-11-18 ENCOUNTER — Ambulatory Visit (INDEPENDENT_AMBULATORY_CARE_PROVIDER_SITE_OTHER): Payer: BLUE CROSS/BLUE SHIELD | Admitting: Orthopedic Surgery

## 2016-11-27 DIAGNOSIS — M5416 Radiculopathy, lumbar region: Secondary | ICD-10-CM | POA: Diagnosis not present

## 2016-11-27 DIAGNOSIS — Z6841 Body Mass Index (BMI) 40.0 and over, adult: Secondary | ICD-10-CM | POA: Diagnosis not present

## 2016-11-27 DIAGNOSIS — M545 Low back pain: Secondary | ICD-10-CM | POA: Diagnosis not present

## 2016-12-22 DIAGNOSIS — Z6837 Body mass index (BMI) 37.0-37.9, adult: Secondary | ICD-10-CM | POA: Diagnosis not present

## 2016-12-22 DIAGNOSIS — Z01419 Encounter for gynecological examination (general) (routine) without abnormal findings: Secondary | ICD-10-CM | POA: Diagnosis not present

## 2016-12-22 DIAGNOSIS — Z130 Encounter for screening for diseases of the blood and blood-forming organs and certain disorders involving the immune mechanism: Secondary | ICD-10-CM | POA: Diagnosis not present

## 2016-12-22 DIAGNOSIS — Z1389 Encounter for screening for other disorder: Secondary | ICD-10-CM | POA: Diagnosis not present

## 2016-12-22 DIAGNOSIS — Z1231 Encounter for screening mammogram for malignant neoplasm of breast: Secondary | ICD-10-CM | POA: Diagnosis not present

## 2016-12-22 DIAGNOSIS — E8941 Symptomatic postprocedural ovarian failure: Secondary | ICD-10-CM | POA: Diagnosis not present

## 2017-01-11 DIAGNOSIS — M4726 Other spondylosis with radiculopathy, lumbar region: Secondary | ICD-10-CM | POA: Diagnosis not present

## 2017-01-11 DIAGNOSIS — M5417 Radiculopathy, lumbosacral region: Secondary | ICD-10-CM | POA: Diagnosis not present

## 2017-02-17 DIAGNOSIS — M4726 Other spondylosis with radiculopathy, lumbar region: Secondary | ICD-10-CM | POA: Diagnosis not present

## 2017-02-17 DIAGNOSIS — I10 Essential (primary) hypertension: Secondary | ICD-10-CM | POA: Diagnosis not present

## 2017-02-17 DIAGNOSIS — Z6841 Body Mass Index (BMI) 40.0 and over, adult: Secondary | ICD-10-CM | POA: Diagnosis not present

## 2017-03-05 ENCOUNTER — Encounter (INDEPENDENT_AMBULATORY_CARE_PROVIDER_SITE_OTHER): Payer: Self-pay | Admitting: Orthopaedic Surgery

## 2017-03-05 ENCOUNTER — Ambulatory Visit (INDEPENDENT_AMBULATORY_CARE_PROVIDER_SITE_OTHER): Payer: Self-pay

## 2017-03-05 ENCOUNTER — Ambulatory Visit (INDEPENDENT_AMBULATORY_CARE_PROVIDER_SITE_OTHER): Payer: BLUE CROSS/BLUE SHIELD | Admitting: Orthopaedic Surgery

## 2017-03-05 DIAGNOSIS — M25511 Pain in right shoulder: Secondary | ICD-10-CM

## 2017-03-05 DIAGNOSIS — M25562 Pain in left knee: Secondary | ICD-10-CM | POA: Diagnosis not present

## 2017-03-05 DIAGNOSIS — G8929 Other chronic pain: Secondary | ICD-10-CM

## 2017-03-05 MED ORDER — METHYLPREDNISOLONE ACETATE 40 MG/ML IJ SUSP
80.0000 mg | INTRAMUSCULAR | Status: AC | PRN
  Administered 2017-03-05: 80 mg

## 2017-03-05 MED ORDER — BUPIVACAINE HCL 0.5 % IJ SOLN
2.0000 mL | INTRAMUSCULAR | Status: AC | PRN
  Administered 2017-03-05: 2 mL via INTRA_ARTICULAR

## 2017-03-05 MED ORDER — LIDOCAINE HCL 2 % IJ SOLN
2.0000 mL | INTRAMUSCULAR | Status: AC | PRN
  Administered 2017-03-05: 2 mL

## 2017-03-05 NOTE — Progress Notes (Signed)
Office Visit Note   Patient: Debbie Bradshaw           Date of Birth: 1965-03-24           MRN: [id number] Visit Date: 03/05/2017              Requested by: [facility] [address] [place], [place] [address] PCP: [facility]   Assessment & Plan: Visit Diagnoses:  1. Chronic pain of left knee   2. Chronic right shoulder pain     Plan: 7 months status post rotator cuff tear repair right shoulder. Still having some discomfort in the area of the acromioclavicular joint without recurrent injury or trauma. I think her pain is related to the area of resection and will try a cortisone injection. Also having pain in left knee with symptoms consistent with chondromalacia patella. Films demonstrate chondromalacia about the lateral patella facet with a slight tilt. Discussed x-rays and treatment options. We'll see as needed  Follow-Up Instructions: No Follow-up on file.   Orders:  Orders Placed This Encounter  Procedures  . Large Joint Inj: R subacromial bursa  . XR KNEE 3 VIEW LEFT   No orders of the defined types were placed in this encounter.     Procedures: Large Joint Inj: R subacromial bursa on 03/05/2017 10:20 AM Indications: pain and diagnostic evaluation Details: 25 G 1.5 in needle, anterolateral approach  Arthrogram: No  Medications: 2 mL lidocaine 2 %; 2 mL bupivacaine 0.5 %; 80 mg methylPREDNISolone acetate 40 MG/ML Consent was given by the patient. Immediately prior to procedure a time out was called to verify the correct patient, procedure, equipment, support staff and site/side marked as required. Patient was prepped and draped in the usual sterile fashion.       Clinical Data: No additional findings.   Subjective: Chief Complaint  Patient presents with  . Right Shoulder - Routine Post Op    Ms. Lisbon is a 2 y o S/P 7 months Right shoulder scope. Pt having pain in one area that  . Left Knee - Pain, Edema    Left  knee pain has become gradually worse over years. Affects her work  's having some discomfort in the right shoulder area of the acromioclavicular joint. No neck pain or referred discomfort into the arm. Also having some pain along the anterior aspect of her left knee with popping clicking and difficulty with inclines area no injury or trauma. No back pain or groin pain  HPI  Review of Systems  Constitutional: Negative for chills, fatigue and fever.  Eyes: Negative for itching.  Respiratory: Negative for chest tightness and shortness of breath.   Cardiovascular: Negative for chest pain, palpitations and leg swelling.  Gastrointestinal: Negative for blood in stool, constipation and diarrhea.  Endocrine: Negative for polyuria.  Genitourinary: Negative for dysuria.  Musculoskeletal: Negative for back pain, joint swelling, neck pain and neck stiffness.  Allergic/Immunologic: Negative for immunocompromised state.  Neurological: Negative for dizziness and numbness.  Hematological: Does not bruise/bleed easily.  Psychiatric/Behavioral: The patient is not nervous/anxious.      Objective: Vital Signs: There were no vitals taken for this visit.  Physical Exam  Ortho Exam awake alert and oriented 3. Comfortable sitting. Some local tenderness over the acromioclavicular joint right shoulder. Minimally positive crossarm test. Negative impingement testing. Negative empty can. Full overhead motion. Left knee with positive patellar crepitation. Full flexion and extension. No instability. Some minimal medial joint pain. No effusion  neurovascular exam intact distally.  Specialty Comments:  No specialty comments available.  Imaging: No results found.   PMFS History: Patient Active Problem List   Diagnosis Date Noted  . Biceps tendonitis on right 11/05/2016   Past Medical History:  Diagnosis Date  . Hypertension     Family History  Problem Relation Age of Onset  . Heart attack Mother   .  Cancer Father   . Crohn's disease Father   . Osteoporosis Father   . Diabetes Father     Past Surgical History:  Procedure Laterality Date  . CESAREAN SECTION    . SHOULDER ARTHROSCOPY  2018  . TUBAL LIGATION     Social History   Occupational History  . Not on file  Tobacco Use  . Smoking status: Current Every Day Smoker    Packs/day: 0.50    Years: 35.00    Pack years: 17.50    Types: Cigarettes  . Smokeless tobacco: Never Used  Substance and Sexual Activity  . Alcohol use: No  . Drug use: No  . Sexual activity: Yes    Birth control/protection: Surgical

## 2017-03-08 ENCOUNTER — Ambulatory Visit (INDEPENDENT_AMBULATORY_CARE_PROVIDER_SITE_OTHER): Payer: BLUE CROSS/BLUE SHIELD | Admitting: Orthopaedic Surgery

## 2017-03-11 ENCOUNTER — Telehealth (INDEPENDENT_AMBULATORY_CARE_PROVIDER_SITE_OTHER): Payer: Self-pay | Admitting: Orthopaedic Surgery

## 2017-03-11 NOTE — Telephone Encounter (Signed)
Called pt.

## 2017-03-11 NOTE — Telephone Encounter (Signed)
Needs to come in for a cortisone injection-would prefer to try that vs stronger pain pills if ultram not working

## 2017-03-11 NOTE — Telephone Encounter (Signed)
She last received Tramadol on 11/05/2016.

## 2017-03-11 NOTE — Telephone Encounter (Signed)
Per patient Tylenol arthritis, and Ultram not helping with knee pain. Patient ask if something can be called in. [facility] [address]

## 2017-03-16 DIAGNOSIS — E8941 Symptomatic postprocedural ovarian failure: Secondary | ICD-10-CM | POA: Diagnosis not present

## 2017-04-05 ENCOUNTER — Ambulatory Visit (INDEPENDENT_AMBULATORY_CARE_PROVIDER_SITE_OTHER): Payer: BLUE CROSS/BLUE SHIELD | Admitting: Orthopaedic Surgery

## 2017-04-05 ENCOUNTER — Other Ambulatory Visit (INDEPENDENT_AMBULATORY_CARE_PROVIDER_SITE_OTHER): Payer: Self-pay | Admitting: *Deleted

## 2017-04-05 ENCOUNTER — Encounter (INDEPENDENT_AMBULATORY_CARE_PROVIDER_SITE_OTHER): Payer: Self-pay | Admitting: Orthopaedic Surgery

## 2017-04-05 VITALS — BP 137/82 | HR 73 | Resp 16 | Ht 65.0 in | Wt 210.0 lb

## 2017-04-05 DIAGNOSIS — M25511 Pain in right shoulder: Principal | ICD-10-CM

## 2017-04-05 DIAGNOSIS — G8929 Other chronic pain: Secondary | ICD-10-CM | POA: Diagnosis not present

## 2017-04-05 NOTE — Progress Notes (Signed)
Office Visit Note   Patient: Debbie Bradshaw           Date of Birth: 12-25-1965           MRN: [id number] Visit Date: 04/05/2017              Requested by: [facility] [address] [place], [place] [address] PCP: [facility]   Assessment & Plan: Visit Diagnoses:  1. Chronic right shoulder pain     Plan: About 8 months status post rotator cuff tear repair right shoulder.  Think some discomfort that I thought was related to the acromioclavicular joint.  This was injected last office visit.  Had about 2 weeks relief of pain.  Now with certain activities having some discomfort along the anterior aspect of her shoulder with some referred pain into the biceps tendon.  I will order an MRI scan to see if she has a recurrent rotator cuff tear or possibly even biceps tendinitis  Follow-Up Instructions: Return after MRI right shoulder.   Orders:  No orders of the defined types were placed in this encounter.  No orders of the defined types were placed in this encounter.     Procedures: No procedures performed   Clinical Data: No additional findings.   Subjective: Chief Complaint  Patient presents with  . Right Shoulder - Pain  . Shoulder Pain    Right shoulder pain since surgery, sharp, no injury, weakness, pain from shoulder to elbow, limited range of motion, Aleve and Tylenol doesn't help,   Seen last month for some persistent discomfort in her right shoulder post rotator cuff tear repair.  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  With certain activities she has some pain along the anterior aspect of her shoulder with some referred discomfort into the biceps tendon.  No numbness or tingling.  I thought some of her pain was related to her acromioclavicular joint and injected this at her last visit.  Had approximately 2 weeks relief of pain.  HPI  Review of Systems  Constitutional: Positive for activity change.   HENT: Negative for trouble swallowing.   Eyes: Negative for pain.  Respiratory: Negative for shortness of breath.   Cardiovascular: Negative for chest pain.  Gastrointestinal: Negative for constipation.  Endocrine: Negative for cold intolerance.  Genitourinary: Negative for difficulty urinating.  Musculoskeletal: Negative for neck pain.  Skin: Negative for rash.  Allergic/Immunologic: Negative for food allergies.  Neurological: Positive for weakness.  Hematological: Does not bruise/bleed easily.  Psychiatric/Behavioral: Negative for sleep disturbance.     Objective: Vital Signs: BP 137/82 (BP Location: Right Arm, Patient Position: Sitting, Cuff Size: Normal)   Pulse 73   Resp 16   Ht 5\' 5"  (1.651 m)   Wt 210 lb (95.3 kg)   BMI 34.95 kg/m   Physical Exam  Ortho Exam awake alert and oriented x3.  Comfortable sitting.  Some discomfort over the old scar anteriorly right shoulder.  No pain at the acromioclavicular joint..  Minimally positive Speed sign skin intact.  Neurovascular exam intact able to place her arm fully overhead and abduct at least 90 degrees.  No crepitation.  Specialty Comments:  No specialty comments available.  Imaging: No results found.   PMFS History: Patient Active Problem List   Diagnosis Date Noted  . Biceps tendonitis on right 11/05/2016   Past Medical History:  Diagnosis Date  . Hypertension     Family History  Problem Relation Age of Onset  . Heart attack Mother   . Cancer Father   . Crohn's disease Father   . Osteoporosis Father   . Diabetes Father     Past Surgical History:  Procedure Laterality Date  . CESAREAN SECTION    . SHOULDER ARTHROSCOPY  2018  . TUBAL LIGATION     Social History   Occupational History  . Not on file  Tobacco Use  . Smoking status: Current Some Day Smoker    Packs/day: 0.10    Years: 35.00    Pack years: 3.50    Types: Cigarettes  . Smokeless tobacco: Never Used  Substance and Sexual Activity  .  Alcohol use: No  . Drug use: No  . Sexual activity: Yes    Birth control/protection: Surgical

## 2017-04-12 DIAGNOSIS — M5416 Radiculopathy, lumbar region: Secondary | ICD-10-CM | POA: Diagnosis not present

## 2017-04-12 DIAGNOSIS — M4726 Other spondylosis with radiculopathy, lumbar region: Secondary | ICD-10-CM | POA: Diagnosis not present

## 2017-04-14 ENCOUNTER — Telehealth (INDEPENDENT_AMBULATORY_CARE_PROVIDER_SITE_OTHER): Payer: Self-pay | Admitting: Orthopaedic Surgery

## 2017-04-14 NOTE — Telephone Encounter (Signed)
Patient is scheduled for her MRI on 3/24 and is requesting a prescription of Xanax (1-2 pills) due to being claustrophobic.  [facility] [address]

## 2017-04-15 NOTE — Telephone Encounter (Signed)
Xanax .5 mg 1 tab po prior to MRI

## 2017-04-15 NOTE — Telephone Encounter (Signed)
Please advise 

## 2017-04-16 ENCOUNTER — Ambulatory Visit (INDEPENDENT_AMBULATORY_CARE_PROVIDER_SITE_OTHER): Payer: BLUE CROSS/BLUE SHIELD | Admitting: Orthopaedic Surgery

## 2017-04-16 ENCOUNTER — Other Ambulatory Visit (INDEPENDENT_AMBULATORY_CARE_PROVIDER_SITE_OTHER): Payer: Self-pay | Admitting: *Deleted

## 2017-04-16 MED ORDER — ALPRAZOLAM 0.5 MG PO TABS: ORAL_TABLET | ORAL | 0 refills | Status: AC

## 2017-04-18 ENCOUNTER — Ambulatory Visit
Admission: RE | Admit: 2017-04-18 | Discharge: 2017-04-18 | Disposition: A | Discharge status: Home / Self Care | Payer: BLUE CROSS/BLUE SHIELD | Source: Ambulatory Visit | Attending: Orthopaedic Surgery | Admitting: Orthopaedic Surgery

## 2017-04-18 DIAGNOSIS — M25511 Pain in right shoulder: Secondary | ICD-10-CM | POA: Diagnosis not present

## 2017-04-18 DIAGNOSIS — G8929 Other chronic pain: Secondary | ICD-10-CM

## 2017-04-19 ENCOUNTER — Encounter (INDEPENDENT_AMBULATORY_CARE_PROVIDER_SITE_OTHER): Payer: Self-pay | Admitting: Orthopaedic Surgery

## 2017-04-19 ENCOUNTER — Ambulatory Visit (INDEPENDENT_AMBULATORY_CARE_PROVIDER_SITE_OTHER): Payer: BLUE CROSS/BLUE SHIELD | Admitting: Orthopaedic Surgery

## 2017-04-19 DIAGNOSIS — M25511 Pain in right shoulder: Secondary | ICD-10-CM

## 2017-04-19 DIAGNOSIS — G8929 Other chronic pain: Secondary | ICD-10-CM | POA: Diagnosis not present

## 2017-04-19 NOTE — Telephone Encounter (Signed)
SHARON CALLED MEDICINE IN FOR THE PATIENT

## 2017-04-19 NOTE — Progress Notes (Signed)
Office Visit Note   Patient: Debbie Bradshaw           Date of Birth: 1965/03/05           MRN: [id number] Visit Date: 04/19/2017              Requested by: [facility] Nakayama[facility], [facility] [address] [place], [place] [id number] PCP: Daiva NakayamaPa, [facility]   Assessment & Plan: Visit Diagnoses:  1. Chronic right shoulder pain     Plan: Recurrent rotator cuff tear of supraspinatus tendon right shoulder.  There also is an associated biceps tendinosis.  Long discussion with Debbie Bradshaw regarding the findings.  She was given a copy of the MRI scan report.  We also discussed different treatment options including continued therapy and exercises versus operative repair.  She needs to go home and discuss this with her husband and think about her time out of work  Follow-Up Instructions: Return if symptoms worsen or fail to improve.   Orders:  No orders of the defined types were placed in this encounter.  No orders of the defined types were placed in this encounter.     Procedures: No procedures performed   Clinical Data: No additional findings.   Subjective: No chief complaint on file. Debbie Bradshaw is about 9 months status post rotator cuff tear repair of right shoulder with an acromial clavicular resection and subacromial decompression.  She has had some recurrent pain in her shoulder.  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  Thus I ordered an MRI scan.  The scan results are as above demonstrate a new tear of the supraspinatus without atrophy.  About 7 or 8 mm in diameter and about 7 mm retracted.  There is some biceps tendinosis  HPI  Review of Systems   Objective: Vital Signs: There were no vitals taken for this visit.  Physical Exam  Ortho Exam awake alert and oriented x3.  Comfortable  sitting.  Positive impingement is mild and external rotation.  Able to place her arm overhead with a little bit of her circuitous motion.  Positive empty can test.  Good strength.  Skin intact.  No pain today at the acromioclavicular joint.  Cross arm test was negative.  Positive Speed sign.  Specialty Comments:  No specialty comments available.  Imaging: No results found.   PMFS History: Patient Active Problem List   Diagnosis Date Noted  . Biceps tendonitis on right 11/05/2016   Past Medical History:  Diagnosis Date  . Hypertension     Family History  Problem Relation Age of Onset  . Heart attack Mother   . Cancer Father   . Crohn's disease Father   . Osteoporosis Father   . Diabetes Father     Past Surgical History:  Procedure Laterality Date  . CESAREAN SECTION    . SHOULDER ARTHROSCOPY  2018  . TUBAL LIGATION     Social History   Occupational History  . Not on file  Tobacco Use  . Smoking status: Current Some Day Smoker    Packs/day: 0.10    Years: 35.00    Pack years: 3.50    Types: Cigarettes  . Smokeless tobacco: Never Used  Substance and Sexual Activity  . Alcohol use: No  . Drug use: No  . Sexual activity: Yes  Birth control/protection: Surgical     Garald Balding, MD   Note - This record has been created using [employer].  Chart creation errors have been sought, but may not always  have been located. Such creation errors do not reflect on  the standard of medical care.

## 2017-04-27 ENCOUNTER — Telehealth (INDEPENDENT_AMBULATORY_CARE_PROVIDER_SITE_OTHER): Payer: Self-pay | Admitting: Orthopaedic Surgery

## 2017-04-27 NOTE — Telephone Encounter (Signed)
Patient called requesting a prescription of Hydrocodone due to pain in her shoulder.  Patient states she plans to schedule surgery once she can work out the schedule with her husband.  [facility] [address]

## 2017-04-29 NOTE — Telephone Encounter (Signed)
Please advise 

## 2017-04-30 ENCOUNTER — Other Ambulatory Visit (INDEPENDENT_AMBULATORY_CARE_PROVIDER_SITE_OTHER): Payer: Self-pay | Admitting: Radiology

## 2017-04-30 MED ORDER — HYDROCODONE-ACETAMINOPHEN 5-325 MG PO TABS: ORAL_TABLET | ORAL | 0 refills | Status: DC

## 2017-04-30 NOTE — Telephone Encounter (Signed)
Prescription given to you 

## 2017-04-30 NOTE — Telephone Encounter (Signed)
Pt coming to pick up from the front desk.

## 2017-05-19 DIAGNOSIS — M4726 Other spondylosis with radiculopathy, lumbar region: Secondary | ICD-10-CM | POA: Diagnosis not present

## 2017-05-20 ENCOUNTER — Telehealth (INDEPENDENT_AMBULATORY_CARE_PROVIDER_SITE_OTHER): Payer: Self-pay | Admitting: Orthopaedic Surgery

## 2017-05-20 ENCOUNTER — Other Ambulatory Visit (INDEPENDENT_AMBULATORY_CARE_PROVIDER_SITE_OTHER): Payer: Self-pay | Admitting: Orthopedic Surgery

## 2017-05-20 MED ORDER — HYDROCODONE-ACETAMINOPHEN 5-325 MG PO TABS: 1.0000 | ORAL_TABLET | Freq: Four times a day (QID) | ORAL | 0 refills | Status: DC | PRN

## 2017-05-20 NOTE — Telephone Encounter (Signed)
Arlys JohnBrian sent refills in through the computer

## 2017-05-20 NOTE — Telephone Encounter (Signed)
Patient called requesting prescription refill of Hydrocodone [facility] [address]

## 2017-05-20 NOTE — Telephone Encounter (Signed)
Ok to renew?  

## 2017-05-20 NOTE — Telephone Encounter (Signed)
Please advise 

## 2017-05-28 ENCOUNTER — Telehealth (INDEPENDENT_AMBULATORY_CARE_PROVIDER_SITE_OTHER): Payer: Self-pay | Admitting: Orthopaedic Surgery

## 2017-05-28 NOTE — Telephone Encounter (Signed)
Patient states she has a few pills left and is taking 1 every 6 hours as directed and did not want to run out. She is working in Consulting civil engineer. I notified her she would have to wait a few more weeks  Just wanted to let you know

## 2017-05-28 NOTE — Telephone Encounter (Signed)
PLEASE ADVISE.

## 2017-05-28 NOTE — Telephone Encounter (Signed)
Patient called requesting an RX refill on her Hydrocodone.  CB# [phone].  Thank you.

## 2017-05-28 NOTE — Telephone Encounter (Signed)
Too early for another refill- has to wait several weeks. Just refilled last week

## 2017-06-04 ENCOUNTER — Other Ambulatory Visit (INDEPENDENT_AMBULATORY_CARE_PROVIDER_SITE_OTHER): Payer: Self-pay | Admitting: Orthopaedic Surgery

## 2017-06-04 ENCOUNTER — Telehealth (INDEPENDENT_AMBULATORY_CARE_PROVIDER_SITE_OTHER): Payer: Self-pay | Admitting: Orthopaedic Surgery

## 2017-06-04 MED ORDER — HYDROCODONE-ACETAMINOPHEN 5-325 MG PO TABS: 1.0000 | ORAL_TABLET | Freq: Four times a day (QID) | ORAL | 0 refills | Status: DC | PRN

## 2017-06-04 NOTE — Telephone Encounter (Signed)
Prescription sent

## 2017-06-04 NOTE — Telephone Encounter (Signed)
Patient called requesting prescription refill of Hydrocodone.  [facility] [address]

## 2017-06-04 NOTE — Telephone Encounter (Signed)
Please advise 

## 2017-06-13 DIAGNOSIS — L0889 Other specified local infections of the skin and subcutaneous tissue: Secondary | ICD-10-CM | POA: Diagnosis not present

## 2017-06-17 ENCOUNTER — Telehealth (INDEPENDENT_AMBULATORY_CARE_PROVIDER_SITE_OTHER): Payer: Self-pay | Admitting: Orthopaedic Surgery

## 2017-06-17 NOTE — Telephone Encounter (Signed)
Patient called requesting prescription refill of Hydrocodone to be [facility] [address]

## 2017-06-17 NOTE — Telephone Encounter (Signed)
Please advise 

## 2017-06-18 NOTE — Telephone Encounter (Signed)
Notified pt that it was to early for refill and would have to wait about 2 weeks. Pt states she will call back then and is also working on getting things set up for surgery for the future.

## 2017-06-18 NOTE — Telephone Encounter (Signed)
Too early for renew-2 weeks

## 2017-06-22 NOTE — Telephone Encounter (Signed)
Will only renew once a month

## 2017-06-25 ENCOUNTER — Telehealth (INDEPENDENT_AMBULATORY_CARE_PROVIDER_SITE_OTHER): Payer: Self-pay | Admitting: Orthopaedic Surgery

## 2017-06-25 NOTE — Telephone Encounter (Signed)
Patient called requesting an RX refill on her Hydrocodone.  [facility] [address]  CB#[phone].  Thank you.

## 2017-06-25 NOTE — Telephone Encounter (Signed)
Please advise 06/04/17 was last refill

## 2017-06-28 ENCOUNTER — Other Ambulatory Visit (INDEPENDENT_AMBULATORY_CARE_PROVIDER_SITE_OTHER): Payer: Self-pay | Admitting: Radiology

## 2017-06-28 ENCOUNTER — Telehealth (INDEPENDENT_AMBULATORY_CARE_PROVIDER_SITE_OTHER): Payer: Self-pay | Admitting: Orthopaedic Surgery

## 2017-06-28 NOTE — Telephone Encounter (Signed)
Ok

## 2017-06-28 NOTE — Telephone Encounter (Signed)
Patient calling requesting a refill on Hydrocodone. [facility] [address] [address].

## 2017-06-28 NOTE — Telephone Encounter (Signed)
YOU WILL HAVE TO SEND IN THROUGH [facility]

## 2017-06-28 NOTE — Telephone Encounter (Signed)
Ok to renew?  

## 2017-06-28 NOTE — Telephone Encounter (Signed)
Please advise 

## 2017-06-28 NOTE — Telephone Encounter (Signed)
Done

## 2017-06-30 ENCOUNTER — Telehealth (INDEPENDENT_AMBULATORY_CARE_PROVIDER_SITE_OTHER): Payer: Self-pay | Admitting: Orthopaedic Surgery

## 2017-06-30 NOTE — Telephone Encounter (Signed)
Patient called stating Dr. Cleophas DunkerWhitfield was going to call her in a prescription for Hydrocodone [facility] [address]  Patient states she called today and they still have not received the prescription.

## 2017-06-30 NOTE — Telephone Encounter (Signed)
Please advise 

## 2017-07-01 ENCOUNTER — Other Ambulatory Visit (INDEPENDENT_AMBULATORY_CARE_PROVIDER_SITE_OTHER): Payer: Self-pay | Admitting: Radiology

## 2017-07-01 MED ORDER — HYDROCODONE-ACETAMINOPHEN 5-325 MG PO TABS: 1.0000 | ORAL_TABLET | Freq: Four times a day (QID) | ORAL | 0 refills | Status: DC | PRN

## 2017-07-01 NOTE — Telephone Encounter (Signed)
Notified pt ready for pick up at front desk

## 2017-07-01 NOTE — Telephone Encounter (Signed)
I sent it in via impravata-write script and I will sign

## 2017-07-01 NOTE — Telephone Encounter (Signed)
PRINTED OFF WAITING FOR SIGNATURE

## 2017-07-07 ENCOUNTER — Telehealth (INDEPENDENT_AMBULATORY_CARE_PROVIDER_SITE_OTHER): Payer: Self-pay | Admitting: Orthopaedic Surgery

## 2017-07-07 NOTE — Telephone Encounter (Signed)
Patient called to say that she would like to go ahead and proceed with Right Shoulder surgery.  She is thinking about  23rd of July for a surgery date.  If you will provide me with a surgery sheet, I will get started on posting her surgery.

## 2017-07-08 NOTE — Telephone Encounter (Signed)
[facility]

## 2017-07-19 ENCOUNTER — Telehealth (INDEPENDENT_AMBULATORY_CARE_PROVIDER_SITE_OTHER): Payer: Self-pay | Admitting: Orthopaedic Surgery

## 2017-07-19 ENCOUNTER — Other Ambulatory Visit (INDEPENDENT_AMBULATORY_CARE_PROVIDER_SITE_OTHER): Payer: Self-pay | Admitting: Orthopedic Surgery

## 2017-07-19 MED ORDER — HYDROCODONE-ACETAMINOPHEN 5-325 MG PO TABS: 1.0000 | ORAL_TABLET | Freq: Four times a day (QID) | ORAL | 0 refills | Status: DC | PRN

## 2017-07-19 NOTE — Telephone Encounter (Signed)
Patient called requesting prescription refill of Hydrocodone [facility] [address].  Patient states she is scheduled for surgery on  08/12/17.  Patient is aware that Dr. Cleophas DunkerWhitfield is out of the office.

## 2017-07-19 NOTE — Telephone Encounter (Signed)
NOTIFED PT TO USE SPARINGLY. PT STATES SHE HAS BEEN SET UP FOR SURGERY BC HER KNEE IS GETTING WORSE

## 2017-07-19 NOTE — Telephone Encounter (Signed)
Done!!  She needs to use sparingly

## 2017-07-19 NOTE — Telephone Encounter (Signed)
Please advise 

## 2017-08-02 ENCOUNTER — Telehealth (INDEPENDENT_AMBULATORY_CARE_PROVIDER_SITE_OTHER): Payer: Self-pay | Admitting: Orthopaedic Surgery

## 2017-08-02 ENCOUNTER — Other Ambulatory Visit (INDEPENDENT_AMBULATORY_CARE_PROVIDER_SITE_OTHER): Payer: Self-pay | Admitting: Radiology

## 2017-08-02 MED ORDER — HYDROCODONE-ACETAMINOPHEN 5-325 MG PO TABS: 1.0000 | ORAL_TABLET | Freq: Four times a day (QID) | ORAL | 0 refills | Status: DC | PRN

## 2017-08-02 NOTE — Telephone Encounter (Signed)
NOTIFIED PT  RX WAS READY FOR PICK UP AT FRONT DESK

## 2017-08-02 NOTE — Telephone Encounter (Signed)
Patient called requesting prescription refill of Hydrocodone [facility] [address]  Patient states she is scheduled for surgery on 08/12/17.

## 2017-08-02 NOTE — Telephone Encounter (Signed)
Ok to renew?  

## 2017-08-02 NOTE — Telephone Encounter (Signed)
PLEASE ADVISE.

## 2017-08-12 ENCOUNTER — Encounter: Payer: Self-pay | Admitting: Orthopaedic Surgery

## 2017-08-12 DIAGNOSIS — S46011A Strain of muscle(s) and tendon(s) of the rotator cuff of right shoulder, initial encounter: Secondary | ICD-10-CM | POA: Diagnosis not present

## 2017-08-12 DIAGNOSIS — M659 Synovitis and tenosynovitis, unspecified: Secondary | ICD-10-CM | POA: Diagnosis not present

## 2017-08-12 DIAGNOSIS — M7581 Other shoulder lesions, right shoulder: Secondary | ICD-10-CM | POA: Diagnosis not present

## 2017-08-12 DIAGNOSIS — M75121 Complete rotator cuff tear or rupture of right shoulder, not specified as traumatic: Secondary | ICD-10-CM

## 2017-08-12 DIAGNOSIS — M67311 Transient synovitis, right shoulder: Secondary | ICD-10-CM

## 2017-08-12 DIAGNOSIS — T85692A Other mechanical complication of permanent sutures, initial encounter: Secondary | ICD-10-CM

## 2017-08-12 DIAGNOSIS — X58XXXA Exposure to other specified factors, initial encounter: Secondary | ICD-10-CM | POA: Diagnosis not present

## 2017-08-12 DIAGNOSIS — G8918 Other acute postprocedural pain: Secondary | ICD-10-CM | POA: Diagnosis not present

## 2017-08-12 DIAGNOSIS — Y999 Unspecified external cause status: Secondary | ICD-10-CM | POA: Diagnosis not present

## 2017-08-12 DIAGNOSIS — M24111 Other articular cartilage disorders, right shoulder: Secondary | ICD-10-CM | POA: Diagnosis not present

## 2017-08-16 ENCOUNTER — Encounter (INDEPENDENT_AMBULATORY_CARE_PROVIDER_SITE_OTHER): Payer: Self-pay | Admitting: Orthopaedic Surgery

## 2017-08-16 ENCOUNTER — Other Ambulatory Visit (INDEPENDENT_AMBULATORY_CARE_PROVIDER_SITE_OTHER): Payer: Self-pay | Admitting: *Deleted

## 2017-08-16 ENCOUNTER — Ambulatory Visit (INDEPENDENT_AMBULATORY_CARE_PROVIDER_SITE_OTHER): Payer: BLUE CROSS/BLUE SHIELD | Admitting: Orthopaedic Surgery

## 2017-08-16 VITALS — BP 130/80 | HR 66 | Ht 65.0 in | Wt 210.0 lb

## 2017-08-16 DIAGNOSIS — M25511 Pain in right shoulder: Principal | ICD-10-CM

## 2017-08-16 DIAGNOSIS — G8929 Other chronic pain: Secondary | ICD-10-CM

## 2017-08-16 NOTE — Progress Notes (Signed)
Office Visit Note   Patient: Debbie Bradshaw           Date of Birth: Feb 14, 1965           MRN: [id number] Visit Date: 08/16/2017              Requested by: Daiva NakayamaPa, [facility] [address] [place], [place] [id number] PCP: Daiva NakayamaPa, [facility]   Assessment & Plan: Visit Diagnoses:  1. Chronic right shoulder pain     Plan: 4 days status post repeat rotator cuff tear repair right shoulder.  Doing well.  Dilaudid making her itch.  Can try Benadryl.  Has hydrocodone at home.  Instructions on using the sling.  Change to dressing.  Wounds are clean and dry.  Start therapy next week at [facility] in [place].  Office 2 weeks.  Long discussion regarding allowing the cuff to heal and need for sling.  Follow-Up Instructions: Return in about 2 weeks (around 08/30/2017).   Orders:  No orders of the defined types were placed in this encounter.  No orders of the defined types were placed in this encounter.     Procedures: No procedures performed   Clinical Data: No additional findings.   Subjective: Chief Complaint  Patient presents with  . Follow-up    08/12/17 R SHOULDER SURGERY F/U THINGS GOING OK  4 days status post arthroscopic evaluation of right shoulder and repeat mini open rotator cuff tear repair.  Tear involved predominantly supraspinatus with a V shaped tear.  No significant muscle atrophy.  I repaired this primarily without a patch and without need for anchors.  Doing well over the weekend.  No fever or chills.  No numbness or tingling  HPI  Review of Systems  Constitutional: Negative for fatigue and fever.  HENT: Negative for ear pain.   Eyes: Negative for pain.  Respiratory: Negative for cough and shortness of breath.   Cardiovascular: Negative for leg swelling.  Gastrointestinal: Negative for constipation and diarrhea.  Genitourinary: Negative for difficulty urinating.  Musculoskeletal: Negative for back pain and neck pain.  Skin: Negative for  rash.  Allergic/Immunologic: Negative for food allergies.  Neurological: Negative for weakness and numbness.  Hematological: Bruises/bleeds easily.  Psychiatric/Behavioral: Positive for sleep disturbance.     Objective: Vital Signs: BP 130/80 (BP Location: Left Arm, Patient Position: Sitting, Cuff Size: Normal)   Pulse 66   Ht 5\' 5"  (1.651 m)   Wt 210 lb (95.3 kg)   BMI 34.95 kg/m   Physical Exam  Ortho Exam awake alert and oriented x3.  Comfortable sitting incisions right shoulder clean and dry.  New Steri-Strips applied.  Neurovascular exam intact.  Specialty Comments:  No specialty comments available.  Imaging: No results found.   PMFS History: Patient Active Problem List   Diagnosis Date Noted  . Biceps tendonitis on right 11/05/2016   Past Medical History:  Diagnosis Date  . Hypertension     Family History  Problem Relation Age of Onset  . Heart attack Mother   . Cancer Father   . Crohn's disease Father   . Osteoporosis Father   . Diabetes Father     Past Surgical History:  Procedure Laterality Date  . CESAREAN SECTION    . SHOULDER ARTHROSCOPY  2018  . TUBAL LIGATION     Social History   Occupational History  . Not on file  Tobacco Use  . Smoking status: Current Some Day Smoker    Packs/day: 0.10  Years: 35.00    Pack years: 3.50    Types: Cigarettes  . Smokeless tobacco: Never Used  Substance and Sexual Activity  . Alcohol use: No  . Drug use: No  . Sexual activity: Yes    Birth control/protection: Surgical

## 2017-08-18 ENCOUNTER — Telehealth (INDEPENDENT_AMBULATORY_CARE_PROVIDER_SITE_OTHER): Payer: Self-pay | Admitting: Orthopaedic Surgery

## 2017-08-18 NOTE — Telephone Encounter (Signed)
Patient request a different pain medcine to be called in. Per patient meds after surgery too strong, and makes her itch. Please send into [facility] [address].

## 2017-08-18 NOTE — Telephone Encounter (Signed)
I called patient. Patient wants Norco. Thank you.

## 2017-08-19 ENCOUNTER — Other Ambulatory Visit (INDEPENDENT_AMBULATORY_CARE_PROVIDER_SITE_OTHER): Payer: Self-pay | Admitting: *Deleted

## 2017-08-19 MED ORDER — HYDROCODONE-ACETAMINOPHEN 5-325 MG PO TABS: 1.0000 | ORAL_TABLET | Freq: Four times a day (QID) | ORAL | 0 refills | Status: DC | PRN

## 2017-08-19 NOTE — Telephone Encounter (Signed)
Printed, Dr. Cleophas DunkerWhitfield signed, patient picked up RX

## 2017-08-19 NOTE — Telephone Encounter (Signed)
Signed rx this am

## 2017-08-24 DIAGNOSIS — M25611 Stiffness of right shoulder, not elsewhere classified: Secondary | ICD-10-CM | POA: Diagnosis not present

## 2017-08-24 DIAGNOSIS — Z6841 Body Mass Index (BMI) 40.0 and over, adult: Secondary | ICD-10-CM | POA: Diagnosis not present

## 2017-08-24 DIAGNOSIS — M25511 Pain in right shoulder: Secondary | ICD-10-CM | POA: Diagnosis not present

## 2017-08-24 DIAGNOSIS — R252 Cramp and spasm: Secondary | ICD-10-CM | POA: Diagnosis not present

## 2017-08-24 DIAGNOSIS — G2581 Restless legs syndrome: Secondary | ICD-10-CM | POA: Diagnosis not present

## 2017-08-27 ENCOUNTER — Telehealth (INDEPENDENT_AMBULATORY_CARE_PROVIDER_SITE_OTHER): Payer: Self-pay | Admitting: Orthopaedic Surgery

## 2017-08-27 NOTE — Telephone Encounter (Signed)
Patient called requesting prescription refill of hydrocodone [facility] [address] in [place].

## 2017-08-30 ENCOUNTER — Telehealth (INDEPENDENT_AMBULATORY_CARE_PROVIDER_SITE_OTHER): Payer: Self-pay | Admitting: Orthopaedic Surgery

## 2017-08-30 NOTE — Telephone Encounter (Signed)
Please advise 

## 2017-08-30 NOTE — Telephone Encounter (Signed)
Patient called to check on the status of her prescription refill of Hydrocodone [facility] [address].

## 2017-08-31 ENCOUNTER — Ambulatory Visit (INDEPENDENT_AMBULATORY_CARE_PROVIDER_SITE_OTHER): Payer: BLUE CROSS/BLUE SHIELD | Admitting: Orthopaedic Surgery

## 2017-08-31 ENCOUNTER — Other Ambulatory Visit (INDEPENDENT_AMBULATORY_CARE_PROVIDER_SITE_OTHER): Payer: Self-pay | Admitting: Orthopedic Surgery

## 2017-08-31 DIAGNOSIS — M25611 Stiffness of right shoulder, not elsewhere classified: Secondary | ICD-10-CM | POA: Diagnosis not present

## 2017-08-31 DIAGNOSIS — M25511 Pain in right shoulder: Secondary | ICD-10-CM | POA: Diagnosis not present

## 2017-08-31 MED ORDER — HYDROCODONE-ACETAMINOPHEN 5-325 MG PO TABS: 1.0000 | ORAL_TABLET | Freq: Four times a day (QID) | ORAL | 0 refills | Status: DC | PRN

## 2017-08-31 NOTE — Telephone Encounter (Signed)
Escribed in, please call her

## 2017-08-31 NOTE — Telephone Encounter (Signed)
NOTIFIED PT MEDS WERE CALLED IN

## 2017-09-03 ENCOUNTER — Encounter (INDEPENDENT_AMBULATORY_CARE_PROVIDER_SITE_OTHER): Payer: Self-pay | Admitting: Orthopaedic Surgery

## 2017-09-03 ENCOUNTER — Ambulatory Visit (INDEPENDENT_AMBULATORY_CARE_PROVIDER_SITE_OTHER): Payer: BLUE CROSS/BLUE SHIELD | Admitting: Orthopaedic Surgery

## 2017-09-03 DIAGNOSIS — G8929 Other chronic pain: Secondary | ICD-10-CM

## 2017-09-03 DIAGNOSIS — M25511 Pain in right shoulder: Secondary | ICD-10-CM

## 2017-09-03 NOTE — Progress Notes (Signed)
   Office Visit Note   Patient: Debbie Bradshaw           Date of Birth: 06-23-1965           MRN: [id number] Visit Date: 09/03/2017              Requested by: Daiva NakayamaPa, [facility] [address] [place] [id number] PCP: Daiva NakayamaPa, [facility]   Assessment & Plan: Visit Diagnoses:  1. Chronic right shoulder pain     Plan: 3 weeks status post repeat rotator cuff tear repair right shoulder doing very well.  Receiving therapy in May have been.  Not taking any pain meds.  Continues to wear a sling.  Will  evaluate in 3 weeks.  Continue with the sling.  Doing very well  Follow-Up Instructions: Return in about 3 weeks (around 09/24/2017).   Orders:  No orders of the defined types were placed in this encounter.  No orders of the defined types were placed in this encounter.     Procedures: No procedures performed   Clinical Data: No additional findings.   Subjective: No chief complaint on file. Debbie Bradshaw is 3 weeks status post repeat rotator cuff tear repair of her right shoulder.  Is only doing quite well without any appreciable pain except at night when she has a little discomfort.  Has been receiving physical therapy and may have been.  Feeling much better than she was before surgery and better than she was even after the first surgery denies shortness of breath or chest pain.  No fever or chills  HPI  Review of Systems   Objective: Vital Signs: There were no vitals taken for this visit.  Physical Exam  Ortho Exam awake alert and oriented x3.  Comfortable sitting.  I was able to passively abduct 90 degrees and place her arm fully over her head.  Small stitch is trying to spit at the very superior aspect of the incision without reaction neurovascular exam intact  Specialty Comments:  No specialty comments available.  Imaging: No results found.   PMFS History: Patient Active Problem List   Diagnosis Date Noted  . Biceps tendonitis on right  11/05/2016   Past Medical History:  Diagnosis Date  . Hypertension     Family History  Problem Relation Age of Onset  . Heart attack Mother   . Cancer Father   . Crohn's disease Father   . Osteoporosis Father   . Diabetes Father     Past Surgical History:  Procedure Laterality Date  . CESAREAN SECTION    . SHOULDER ARTHROSCOPY  2018  . TUBAL LIGATION     Social History   Occupational History  . Not on file  Tobacco Use  . Smoking status: Current Some Day Smoker    Packs/day: 0.10    Years: 35.00    Pack years: 3.50    Types: Cigarettes  . Smokeless tobacco: Never Used  Substance and Sexual Activity  . Alcohol use: No  . Drug use: No  . Sexual activity: Yes    Birth control/protection: Surgical     Valeria BatmanPeter W Cregg Jutte, MD   Note - This record has been created using [employer] software.  Chart creation errors have been sought, but may not always  have been located. Such creation errors do not reflect on  the standard of medical care.

## 2017-09-07 DIAGNOSIS — M25611 Stiffness of right shoulder, not elsewhere classified: Secondary | ICD-10-CM | POA: Diagnosis not present

## 2017-09-07 DIAGNOSIS — M25511 Pain in right shoulder: Secondary | ICD-10-CM | POA: Diagnosis not present

## 2017-09-08 ENCOUNTER — Telehealth (INDEPENDENT_AMBULATORY_CARE_PROVIDER_SITE_OTHER): Payer: Self-pay | Admitting: Orthopaedic Surgery

## 2017-09-08 ENCOUNTER — Other Ambulatory Visit (INDEPENDENT_AMBULATORY_CARE_PROVIDER_SITE_OTHER): Payer: Self-pay | Admitting: Orthopedic Surgery

## 2017-09-08 MED ORDER — HYDROCODONE-ACETAMINOPHEN 5-325 MG PO TABS: 1.0000 | ORAL_TABLET | Freq: Four times a day (QID) | ORAL | 0 refills | Status: AC | PRN

## 2017-09-08 NOTE — Telephone Encounter (Signed)
Please advise 

## 2017-09-08 NOTE — Telephone Encounter (Signed)
Rx sent in

## 2017-09-08 NOTE — Telephone Encounter (Signed)
Patient called requesting prescription refill of Hydrocod[facility] [address].

## 2017-09-08 NOTE — Telephone Encounter (Signed)
Patient left a voicemail requesting a return call.   °

## 2017-09-08 NOTE — Telephone Encounter (Signed)
called

## 2017-09-09 NOTE — Telephone Encounter (Signed)
Please call patient and advise. Thank you.

## 2017-09-09 NOTE — Telephone Encounter (Signed)
Pt spoke with Debbie Bradshaw

## 2017-09-14 DIAGNOSIS — M25511 Pain in right shoulder: Secondary | ICD-10-CM | POA: Diagnosis not present

## 2017-09-14 DIAGNOSIS — M25611 Stiffness of right shoulder, not elsewhere classified: Secondary | ICD-10-CM | POA: Diagnosis not present

## 2017-09-17 ENCOUNTER — Telehealth (INDEPENDENT_AMBULATORY_CARE_PROVIDER_SITE_OTHER): Payer: Self-pay | Admitting: Orthopaedic Surgery

## 2017-09-17 NOTE — Telephone Encounter (Signed)
Please advise 

## 2017-09-17 NOTE — Telephone Encounter (Signed)
Patient called requesting prescription refill of Hydrocodone [facility] [address] in [place].

## 2017-09-20 ENCOUNTER — Other Ambulatory Visit (INDEPENDENT_AMBULATORY_CARE_PROVIDER_SITE_OTHER): Payer: Self-pay | Admitting: *Deleted

## 2017-09-20 MED ORDER — HYDROCODONE-ACETAMINOPHEN 5-325 MG PO TABS: 1.0000 | ORAL_TABLET | Freq: Four times a day (QID) | ORAL | 0 refills | Status: AC | PRN

## 2017-09-20 NOTE — Telephone Encounter (Signed)
I called patient, RX at front desk. 

## 2017-09-20 NOTE — Telephone Encounter (Signed)
ok 

## 2017-09-21 DIAGNOSIS — M25611 Stiffness of right shoulder, not elsewhere classified: Secondary | ICD-10-CM | POA: Diagnosis not present

## 2017-09-21 DIAGNOSIS — M25511 Pain in right shoulder: Secondary | ICD-10-CM | POA: Diagnosis not present

## 2017-09-24 ENCOUNTER — Ambulatory Visit (INDEPENDENT_AMBULATORY_CARE_PROVIDER_SITE_OTHER): Payer: BLUE CROSS/BLUE SHIELD | Admitting: Orthopaedic Surgery

## 2017-09-24 ENCOUNTER — Encounter (INDEPENDENT_AMBULATORY_CARE_PROVIDER_SITE_OTHER): Payer: Self-pay | Admitting: Orthopaedic Surgery

## 2017-09-24 DIAGNOSIS — M25511 Pain in right shoulder: Secondary | ICD-10-CM

## 2017-09-24 DIAGNOSIS — G8929 Other chronic pain: Secondary | ICD-10-CM

## 2017-09-24 NOTE — Progress Notes (Signed)
   Office Visit Note   Patient: Debbie Bradshaw           Date of Birth: 1965-04-26           MRN: [id number] Visit Date: 09/24/2017              Requested by: Daiva NakayamaPa, [facility] [address] [place], [place] [id number] PCP: Daiva NakayamaPa, [facility]   Assessment & Plan: Visit Diagnoses:  1. Chronic right shoulder pain    6 weeks status post repeat rotator cuff tear repair and doing well.  No significant pain.  Would like to go back to work wearing the sling and being very careful with use of her right upper extremity.  I think that is fine.  We have had a long discussion regarding implications of recurrent rotator cuff tear. office 1 month Plan:  Follow-Up Instructions: Return in about 1 month (around 10/25/2017).   Orders:  No orders of the defined types were placed in this encounter.  No orders of the defined types were placed in this encounter.     Procedures: No procedures performed   Clinical Data: No additional findings.   Subjective: No chief complaint on file. 6 weeks status post rotator cuff tear repair right shoulder.  This was a repeat surgery.  Debbie Bradshaw is been limited to active in her postoperative course initially.  She realizes that she needs time to heal.  She does smoke.  She is still going to therapy once a week.  She thinks she can return to work at this point being very careful activities.  She thinks she can wear the sling  HPI  Review of Systems   Objective: Vital Signs: There were no vitals taken for this visit.  Physical Exam  Ortho Exam awake alert and comfortable.  She could easily place her right arm fully overhead and scratch the middle of her back incisions of healed nicely.  She did spit a small Monocryl stitch at the superior aspect of the incision but it looks just fine.  Neurovascular exam intact  Specialty Comments:  No specialty comments available.  Imaging: No results found.   PMFS History: Patient Active  Problem List   Diagnosis Date Noted  . Biceps tendonitis on right 11/05/2016   Past Medical History:  Diagnosis Date  . Hypertension     Family History  Problem Relation Age of Onset  . Heart attack Mother   . Cancer Father   . Crohn's disease Father   . Osteoporosis Father   . Diabetes Father     Past Surgical History:  Procedure Laterality Date  . CESAREAN SECTION    . SHOULDER ARTHROSCOPY  2018  . TUBAL LIGATION     Social History   Occupational History  . Not on file  Tobacco Use  . Smoking status: Current Some Day Smoker    Packs/day: 0.10    Years: 35.00    Pack years: 3.50    Types: Cigarettes  . Smokeless tobacco: Never Used  Substance and Sexual Activity  . Alcohol use: No  . Drug use: No  . Sexual activity: Yes    Birth control/protection: Surgical     Valeria BatmanPeter W Eilah Common, MD   Note - This record has been created using [employer] software.  Chart creation errors have been sought, but may not always  have been located. Such creation errors do not reflect on  the standard of medical care.

## 2017-09-28 DIAGNOSIS — M25511 Pain in right shoulder: Secondary | ICD-10-CM | POA: Diagnosis not present

## 2017-09-28 DIAGNOSIS — M25611 Stiffness of right shoulder, not elsewhere classified: Secondary | ICD-10-CM | POA: Diagnosis not present

## 2017-09-30 ENCOUNTER — Ambulatory Visit (INDEPENDENT_AMBULATORY_CARE_PROVIDER_SITE_OTHER): Payer: BLUE CROSS/BLUE SHIELD | Admitting: Neurology

## 2017-09-30 ENCOUNTER — Encounter: Payer: Self-pay | Admitting: Neurology

## 2017-09-30 ENCOUNTER — Telehealth (INDEPENDENT_AMBULATORY_CARE_PROVIDER_SITE_OTHER): Payer: Self-pay | Admitting: Orthopaedic Surgery

## 2017-09-30 VITALS — BP 150/92 | HR 69 | Ht 65.0 in | Wt 218.0 lb

## 2017-09-30 DIAGNOSIS — R0683 Snoring: Secondary | ICD-10-CM

## 2017-09-30 DIAGNOSIS — G2581 Restless legs syndrome: Secondary | ICD-10-CM | POA: Diagnosis not present

## 2017-09-30 DIAGNOSIS — G4762 Sleep related leg cramps: Secondary | ICD-10-CM

## 2017-09-30 DIAGNOSIS — E669 Obesity, unspecified: Secondary | ICD-10-CM | POA: Diagnosis not present

## 2017-09-30 DIAGNOSIS — G4761 Periodic limb movement disorder: Secondary | ICD-10-CM | POA: Diagnosis not present

## 2017-09-30 NOTE — Telephone Encounter (Signed)
Patient called requesting prescription refill of [facility]cod[facility] of [address] and [address].

## 2017-09-30 NOTE — Progress Notes (Signed)
Subjective:    Patient ID: Debbie Bradshaw is a 52 y.o. female.  HPI     Huston Foley, MD, PhD [facility] [address] [address] [place], [place] [address]  Dear Dr. Link Snuffer,   I saw your patient, Debbie Bradshaw, upon your kind request in my neurologic clinic today for initial consultation of her sleep disorder, in particular, concern for underlying obstructive sleep apnea and her restless leg symptoms. The patient is unaccompanied today. As you know, Ms. Hsia is a 52 year old right-handed woman with an underlying medical history of hypertension, right shoulder pain with status post rotator cuff repair in July 2019, anxiety and obesity, who reports some snoring, some daytime somnolence and nocturnal leg cramps. She has had symptoms with restlessness in her legs and difficulty relaxing at night for at least a year. She has been on Mirapex which was quite helpful in the beginning but is no longer as effective. She has had some leg cramps recently. She had blood work in your office including CMP, CBC with differential, ferritin and magnesium recently for workup of her leg cramps. She is married and lives with her husband. She works as a Administrator. They have 1 child, 46 yo daughter (who does not have RLS). She smokes a pack per day, drinks alcohol infrequently, smokes a pack per day, drinks caffeine in the form of soda, 3 bottles per day on average or 3 servings. Her father has leg cramps at night but she believes he does not have restless leg symptoms. Her blood work was normal per her report and she was switched from Mirapex to ropinirole recently, currently taking 0.5 mg at night. She is not sure if it has helped yet. Of note, she had recent shoulder surgery about 7 weeks ago and is still on pain medication, hydrocodone up to 3 pills a day. She works in Aeronautical engineer. I reviewed your office note from 08/24/2017, which you kindly included. Her Epworth  sleepiness score is 4 out of 24, fatigue score is 9 out of 63.  She reports that she moves her legs while asleep. She also wakes up with cramps. She tried to quit smoking in the past but had side effects with Chantix, including anxiety.  Her Past Medical History Is Significant For: Past Medical History:  Diagnosis Date  . Hypertension     Her Past Surgical History Is Significant For: Past Surgical History:  Procedure Laterality Date  . CESAREAN SECTION    . SHOULDER ARTHROSCOPY  2018  . TUBAL LIGATION      Her Family History Is Significant For: Family History  Problem Relation Age of Onset  . Heart attack Mother   . Cancer Father   . Crohn's disease Father   . Osteoporosis Father   . Diabetes Father     Her Social History Is Significant For: Social History   Socioeconomic History  . Marital status: Married    Spouse name: Not on file  . Number of children: Not on file  . Years of education: Not on file  . Highest education level: Not on file  Occupational History  . Not on file  Social Needs  . Financial resource strain: Not on file  . Food insecurity:    Worry: Not on file    Inability: Not on file  . Transportation needs:    Medical: Not on file    Non-medical: Not on file  Tobacco Use  . Smoking status: Current Some Day Smoker  Packs/day: 0.10    Years: 35.00    Pack years: 3.50    Types: Cigarettes  . Smokeless tobacco: Never Used  Substance and Sexual Activity  . Alcohol use: No  . Drug use: No  . Sexual activity: Yes    Birth control/protection: Surgical  Lifestyle  . Physical activity:    Days per week: Not on file    Minutes per session: Not on file  . Stress: Not on file  Relationships  . Social connections:    Talks on phone: Not on file    Gets together: Not on file    Attends religious service: Not on file    Active member of club or organization: Not on file    Attends meetings of clubs or organizations: Not on file    Relationship  status: Not on file  Other Topics Concern  . Not on file  Social History Narrative  . Not on file    Her Allergies Are:  No Known Allergies:   Her Current Medications Are:  Outpatient Encounter Medications as of 09/30/2017  Medication Sig  . ALPRAZolam (XANAX) 0.25 MG tablet Take 1 tablet (0.25 mg total) by mouth 2 (two) times daily as needed for anxiety.  . ALPRAZolam (XANAX) 0.5 MG tablet 1 tab po prior to MRI for anxiety  . Calcium Carbonate-Vitamin D (CALCIUM + D PO) Take 1 tablet by mouth daily.  Marland Kitchen escitalopram (LEXAPRO) 20 MG tablet Take 20 mg by mouth daily.  . fenofibrate (TRICOR) 145 MG tablet TAKE 1 TABLET EVERY MORNING WITH BREAKFAST  . HYDROcodone-acetaminophen (NORCO/VICODIN) 5-325 MG tablet Take 1 tablet by mouth every 6 (six) hours as needed for moderate pain.  Marland Kitchen HYDROcodone-acetaminophen (NORCO/VICODIN) 5-325 MG tablet Take 1 tablet by mouth every 6 (six) hours as needed for moderate pain.  Marland Kitchen lisinopril-hydrochlorothiazide (PRINZIDE,ZESTORETIC) 20-25 MG per tablet Take 1 tablet by mouth daily.  . metoprolol succinate (TOPROL-XL) 100 MG 24 hr tablet Take 100 mg by mouth daily. Take with or immediately following a meal.  . Multiple Vitamins-Minerals (MULTIVITAMIN WITH MINERALS) tablet Take 1 tablet by mouth daily.  Marland Kitchen PARoxetine (PAXIL) 10 MG tablet Take 10 mg by mouth daily.  . SUMAtriptan (IMITREX) 100 MG tablet as needed.   . [DISCONTINUED] mupirocin cream (BACTROBAN) 2 % APPLY TO BOILS DAILY AS NEEDED  . [DISCONTINUED] pramipexole (MIRAPEX) 0.25 MG tablet Take 0.25 mg by mouth at bedtime.  . [DISCONTINUED] traMADol (ULTRAM) 50 MG tablet 1-2 TID PRN  . [DISCONTINUED] traMADol (ULTRAM) 50 MG tablet Take 1-2 po qhs  . [DISCONTINUED] Varenicline Tartrate (CHANTIX PO) Chantix Starting Month Box 0.5 mg (11)-1 mg (42) tablets in dose pack  TAKE AS DIRECTED   No facility-administered encounter medications on file as of 09/30/2017.   :  Review of Systems:  Out of a complete 14  point review of systems, all are reviewed and negative with the exception of these symptoms as listed below: Review of Systems  Neurological:       Pt presents today to discuss her RLS. Pt has never had a sleep study but does endorse snoring when she is really tired.  Epworth Sleepiness Scale 0= would never doze 1= slight chance of dozing 2= moderate chance of dozing 3= high chance of dozing  Sitting and reading: 3 Watching TV: 1 Sitting inactive in a public place (ex. Theater or meeting): 1 As a passenger in a car for an hour without a break: 0 Lying down to rest in the afternoon:  1 Sitting and talking to someone: 0 Sitting quietly after lunch (no alcohol): 0 In a car, while stopped in traffic: 0 Total: 6     Objective:  Neurological Exam  Physical Exam Physical Examination:   Vitals:   09/30/17 1603  BP: (!) 150/92  Pulse: 69   General Examination: The patient is a very pleasant 52 y.o. female in no acute distress. She appears well-developed and well-nourished and well groomed.   HEENT: Normocephalic, atraumatic, pupils are equal, round and reactive to light and accommodation. Funduscopic exam is normal with sharp disc margins noted. Extraocular tracking is good without limitation to gaze excursion or nystagmus noted. Normal smooth pursuit is noted. Hearing is grossly intact. Tympanic membranes are clear bilaterally. Face is symmetric with normal facial animation and normal facial sensation. Speech is clear with no dysarthria noted. There is no hypophonia. There is no lip, neck/head, jaw or voice tremor. Neck is supple with full range of passive and active motion. There are no carotid bruits on auscultation. Oropharynx exam reveals: mild mouth dryness, adequate dental hygiene and mild airway crowding, due to smaller airway entry. Mallampati is class II. Tongue protrudes centrally and palate elevates symmetrically. Tonsils are 1+ in size. Neck size is 17.75 inches. She has a Mild  overbite.  Chest: Clear to auscultation without wheezing, rhonchi or crackles noted.  Heart: S1+S2+0, regular and normal without murmurs, rubs or gallops noted.   Abdomen: Soft, non-tender and non-distended with normal bowel sounds appreciated on auscultation.  Extremities: There is no pitting edema in the distal lower extremities bilaterally. Pedal pulses are intact.  Skin: Warm and dry without trophic changes noted.  Musculoskeletal: exam reveals no obvious joint deformities, tenderness or joint swelling or erythema.   Neurologically:  Mental status: The patient is awake, alert and oriented in all 4 spheres. Her immediate and remote memory, attention, language skills and fund of knowledge are appropriate. There is no evidence of aphasia, agnosia, apraxia or anomia. Speech is clear with normal prosody and enunciation. Thought process is linear. Mood is normal and affect is normal.  Cranial nerves II - XII are as described above under HEENT exam. In addition: shoulder shrug is normal with equal shoulder height noted. Motor exam: Normal bulk, strength and tone is noted. There is no drift, tremor or rebound. Romberg is negative. Reflexes are 2+ throughout. Fine motor skills and coordination: grossly intact.  Cerebellar testing: No dysmetria or intention tremor. There is no truncal or gait ataxia.  Sensory exam: intact to light touch.  Gait, station and balance: She stands easily. No veering to one side is noted. No leaning to one side is noted. Posture is age-appropriate and stance is narrow based. Gait shows normal stride length and normal pace. No problems turning are noted.   Assessment and Plan:   In summary, YARIMA PENMAN is a very pleasant 52 y.o.-year old female with an underlying medical history of hypertension, right shoulder pain with status post rotator cuff repair in July 2019, anxiety and obesity, who presents for evaluation of her sleep disturbance including restless leg  symptoms and leg cramps at night, her history and physical exam also raise the concern for obstructive sleep apnea as an underlying cause of her sleep disturbance. He had recent blood work through your office for evaluation of cramps and for restless legs syndrome. She was recently switched from Mirapex to generic Requip which is currently 0.5 mg each night. She is encouraged to continue this. I had a long  chat with the patient about my findings and the diagnosis of OSA, its prognosis and treatment options. We talked about medical treatments, surgical interventions and non-pharmacological approaches. I explained in particular the risks and ramifications of untreated moderate to severe OSA, especially with respect to developing cardiovascular disease down the Road, including congestive heart failure, difficult to treat hypertension, cardiac arrhythmias, or stroke. Even type 2 diabetes has, in part, been linked to untreated OSA. Symptoms of untreated OSA include daytime sleepiness, memory problems, mood irritability and mood disorder such as depression and anxiety, lack of energy, as well as recurrent headaches, especially morning headaches. We talked about smoking cessation and trying to maintain a healthy lifestyle in general, as well as the importance of weight control. I encouraged the patient to eat healthy, exercise daily and keep well hydrated, to keep a scheduled bedtime and wake time routine, to not skip any meals and eat healthy snacks in between meals. I advised the patient not to drive when feeling sleepy.  I recommended the following at this time: sleep study with potential positive airway pressure titration. (We will score hypopneas at 3%).   I explained the sleep test procedure to the patient and also outlined possible surgical and non-surgical treatment options of OSA, including the use of a custom-made dental device (which would require a referral to a specialist dentist or oral surgeon), upper  airway surgical options, such as pillar implants, radiofrequency surgery, tongue base surgery, and UPPP (which would involve a referral to an ENT surgeon). Rarely, jaw surgery such as mandibular advancement may be considered.  I also explained the CPAP treatment option to the patient, who indicated that she would be willing to try CPAP if the need arises. I explained the importance of being compliant with PAP treatment, not only for insurance purposes but primarily to improve Her symptoms, and for the patient's long term health benefit, including to reduce Her cardiovascular risks. Treating sleep apnea can improve restless leg symptoms and leg twitching at night.  I answered all her questions today and the patient was in agreement. I would like to see her back after the sleep study is completed and encouraged her to call with any interim questions, concerns, problems or updates.   Thank you very much for allowing me to participate in the care of this nice patient. If I can be of any further assistance to you please do not hesitate to call me at [phone].  Sincerely,   Huston Foley, MD, PhD

## 2017-09-30 NOTE — Patient Instructions (Signed)

## 2017-09-30 NOTE — Telephone Encounter (Signed)
Please advise 

## 2017-10-01 NOTE — Telephone Encounter (Signed)
NOTIFIED PT WE CAN REFILL ON MONDAY

## 2017-10-01 NOTE — Telephone Encounter (Signed)
Can have prescription first of week

## 2017-10-04 ENCOUNTER — Other Ambulatory Visit (INDEPENDENT_AMBULATORY_CARE_PROVIDER_SITE_OTHER): Payer: Self-pay | Admitting: Orthopaedic Surgery

## 2017-10-04 MED ORDER — HYDROCODONE-ACETAMINOPHEN 5-325 MG PO TABS: 1.0000 | ORAL_TABLET | Freq: Four times a day (QID) | ORAL | 0 refills | Status: AC | PRN

## 2017-10-04 NOTE — Telephone Encounter (Signed)
PLEASE ADVISE.

## 2017-10-04 NOTE — Telephone Encounter (Signed)
Ok to refill 

## 2017-10-08 IMAGING — MR MR SHOULDER*R* W/O CM
4 of 6 series · 18 of 40 positions shown · non-contrast
Comparison: None.

CLINICAL DATA: Right shoulder pain and limited range of motion for
3 months. No known injury.

EXAM:
MRI OF THE RIGHT SHOULDER WITHOUT CONTRAST
TECHNIQUE: Multiplanar, multisequence MR imaging of the shoulder was performed.
No intravenous contrast was administered.

[Series 6: T2 fat-sat · axial · right · 3.0mm · 0.44mm/px · z∈[-52,+29]mm · 5 of 25 slices shown (1 of 3)]
[im 1/25]
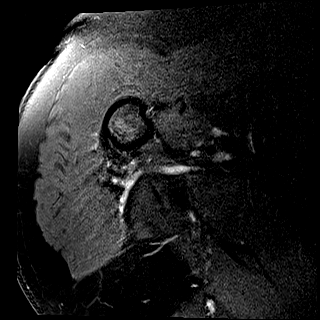
[im 5/25]
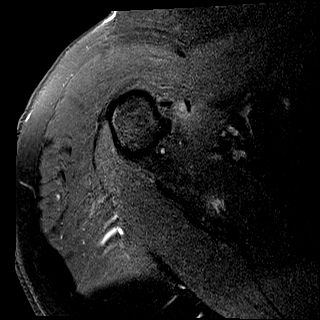
[im 10/25]
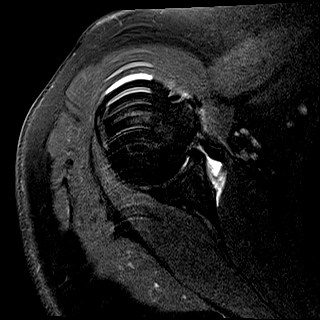
[im 15/25]
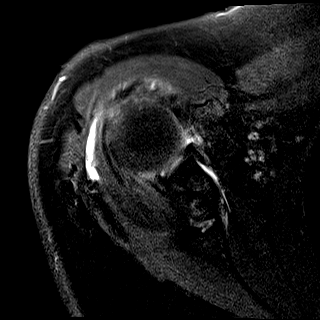
[im 25/25]
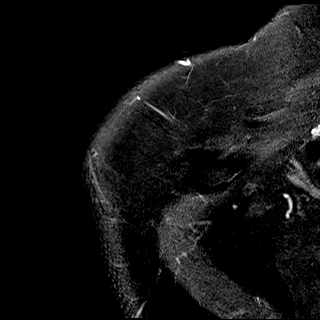

[Series 7: T2 fat-sat · axial · right · 3.0mm · 0.44mm/px · z∈[-39,+29]mm · 3 of 25 slices shown (2 of 3)]
[im 5/25]
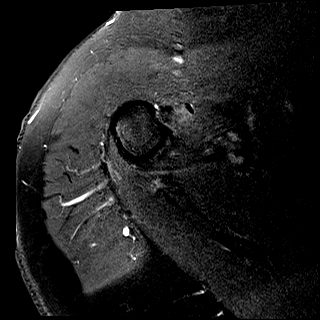
[im 15/25]
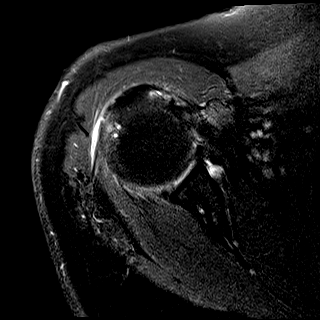
[im 25/25]
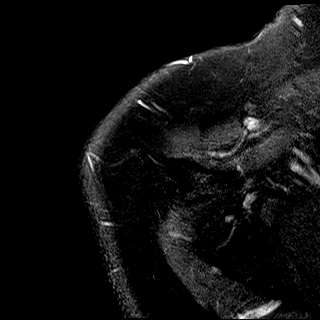

[Series 8: T2 fat-sat · coronal · right · 3.0mm · 0.44mm/px · 3 of 24 slices shown (3 of 3)]
[im 4/24]
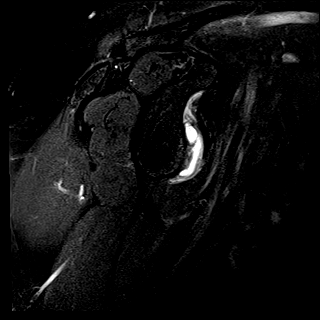
[im 12/24]
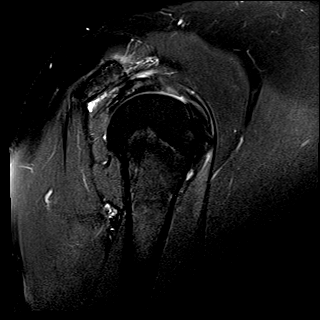
[im 20/24]
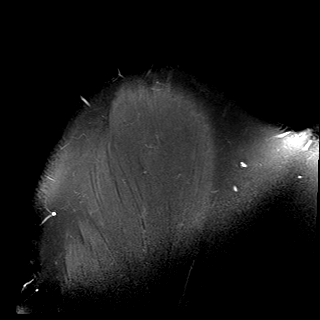

[Series 10: PD · oblique · right · 3.0mm · 0.18mm/px · 7 of 24 slices shown]
[im 1/24]
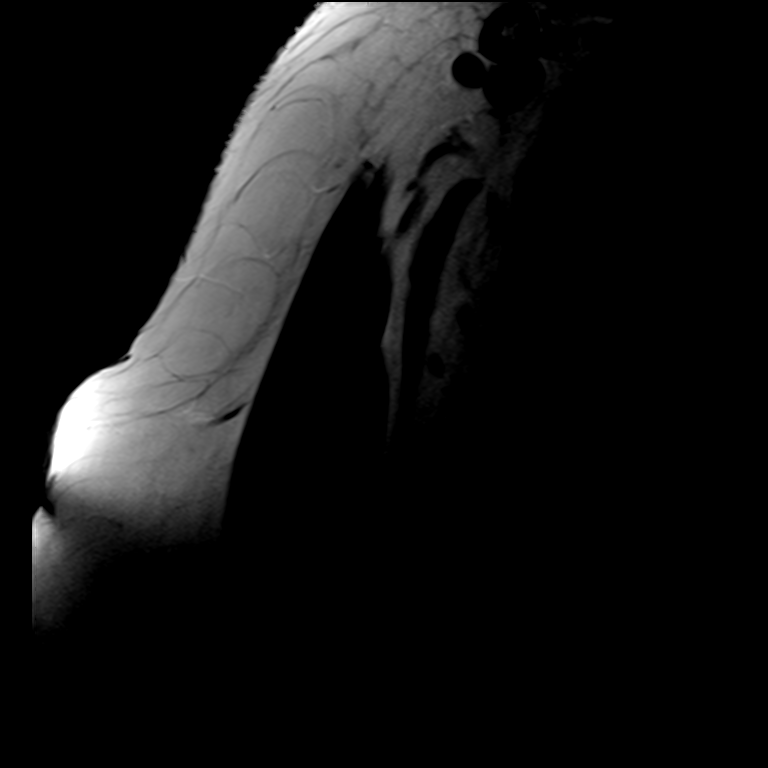
[im 4/24]
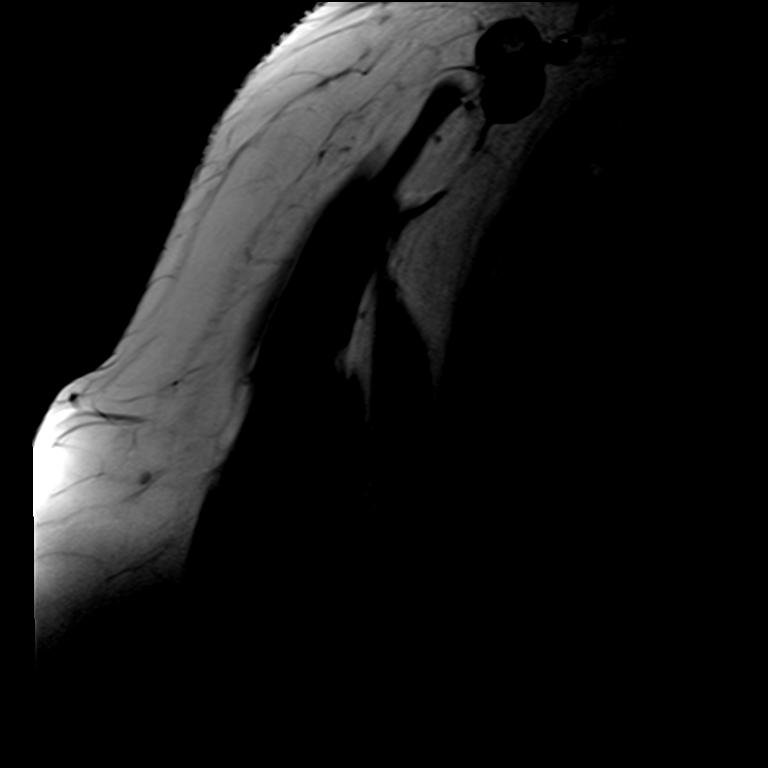
[im 8/24]
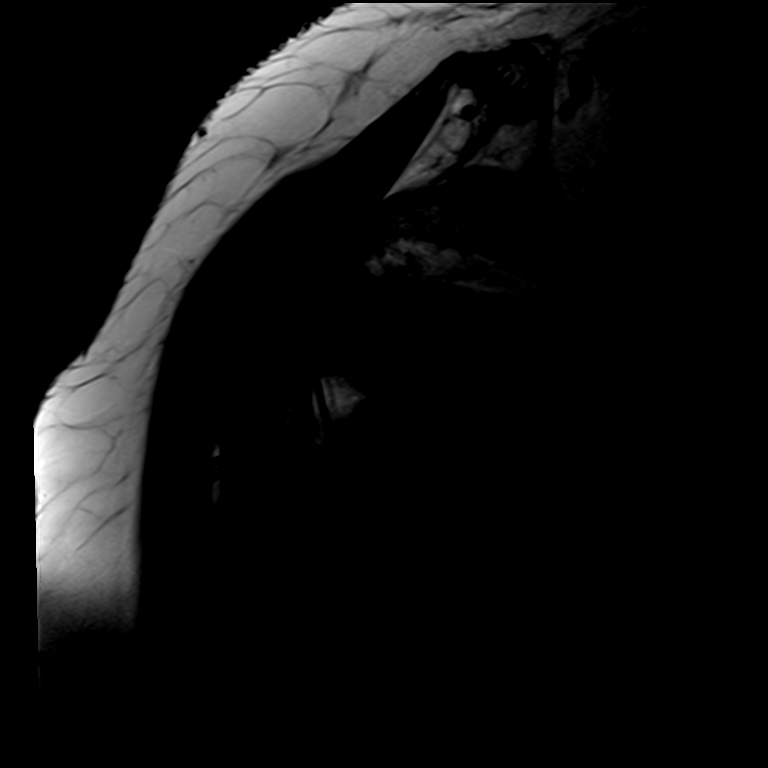
[im 12/24]
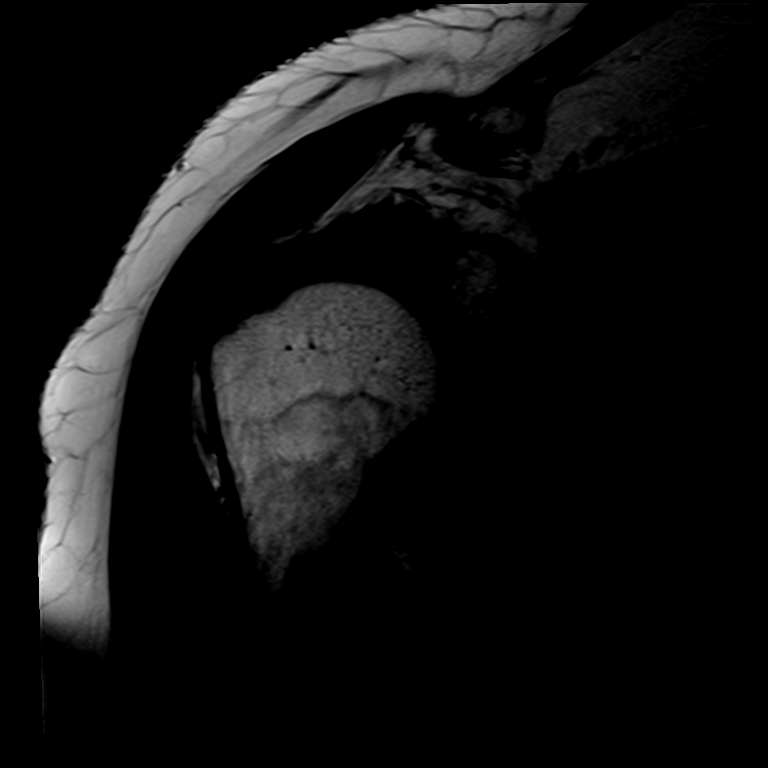
[im 16/24]
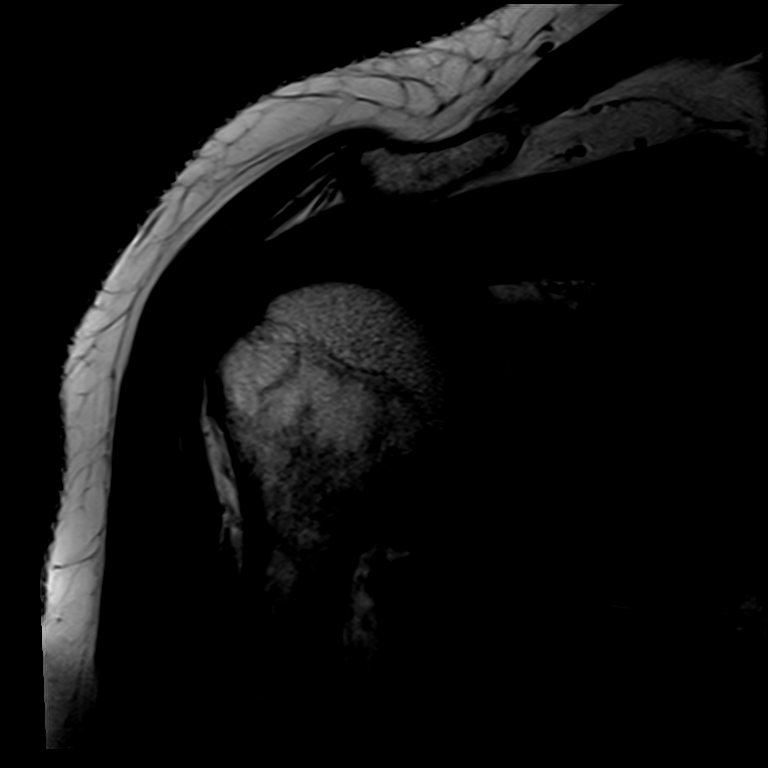
[im 20/24]
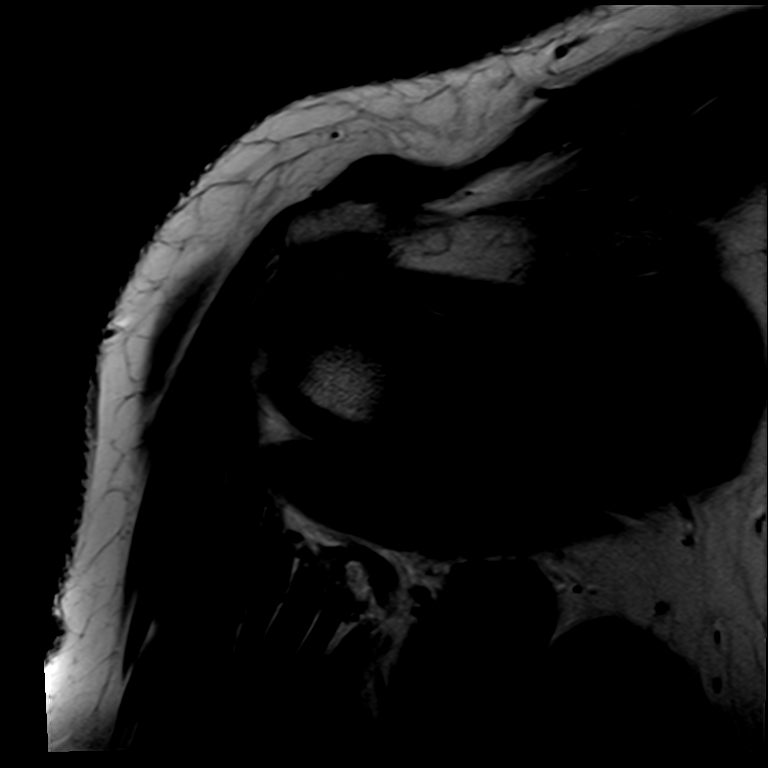
[im 24/24]
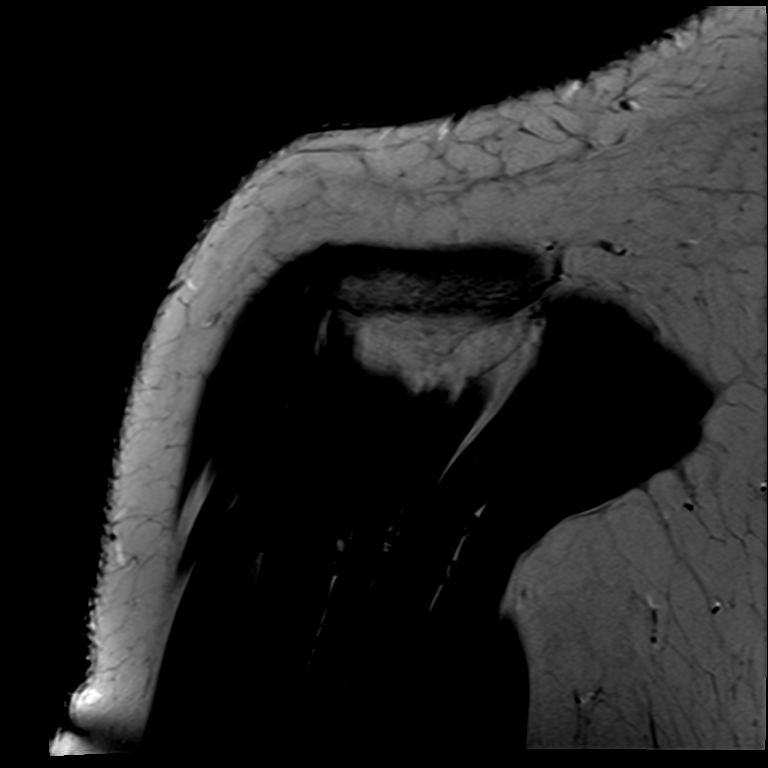

[18 of 40 positions shown; findings below may reference images not displayed]

FINDINGS: Rotator cuff: The patient has moderately severe to severe appearing
rotator cuff tendinopathy, worst in the supraspinatus. No tear is
identified.

Muscles:  No atrophy or focal lesion.

Biceps long head: Intrasubstance increased T2 signal in the
intra-articular segment of the tendon consistent with tendinosis
without tear is identified.

Acromioclavicular Joint: Moderate osteoarthritis is seen. Type 1
acromion. Prominent fluid is seen in the subacromial/subdeltoid
bursa.

Glenohumeral Joint: Negative.

Labrum:  Intact.

Bones:  No fracture or worrisome lesion.

Other: None.
IMPRESSION: Moderately severe to severe appearing rotator cuff tendinopathy is
worst in the supraspinatus. No tear is identified.

Moderate acromioclavicular osteoarthritis.

Subacromial/subdeltoid bursitis.

## 2017-10-12 DIAGNOSIS — M25611 Stiffness of right shoulder, not elsewhere classified: Secondary | ICD-10-CM | POA: Diagnosis not present

## 2017-10-12 DIAGNOSIS — M25511 Pain in right shoulder: Secondary | ICD-10-CM | POA: Diagnosis not present

## 2017-10-14 ENCOUNTER — Other Ambulatory Visit (INDEPENDENT_AMBULATORY_CARE_PROVIDER_SITE_OTHER): Payer: Self-pay | Admitting: Orthopedic Surgery

## 2017-10-14 ENCOUNTER — Telehealth (INDEPENDENT_AMBULATORY_CARE_PROVIDER_SITE_OTHER): Payer: Self-pay | Admitting: Orthopaedic Surgery

## 2017-10-14 MED ORDER — HYDROCODONE-ACETAMINOPHEN 5-325 MG PO TABS: 1.0000 | ORAL_TABLET | Freq: Three times a day (TID) | ORAL | 0 refills | Status: AC | PRN

## 2017-10-14 NOTE — Telephone Encounter (Signed)
Patient called requesting prescription refill of Hydrocodone to be sent to [facility] on [address]

## 2017-10-14 NOTE — Telephone Encounter (Signed)
PLEASE ADVISE.

## 2017-10-19 DIAGNOSIS — M25511 Pain in right shoulder: Secondary | ICD-10-CM | POA: Diagnosis not present

## 2017-10-19 DIAGNOSIS — M25611 Stiffness of right shoulder, not elsewhere classified: Secondary | ICD-10-CM | POA: Diagnosis not present

## 2017-10-22 ENCOUNTER — Ambulatory Visit (INDEPENDENT_AMBULATORY_CARE_PROVIDER_SITE_OTHER): Payer: BLUE CROSS/BLUE SHIELD | Admitting: Orthopaedic Surgery

## 2017-10-22 ENCOUNTER — Encounter (INDEPENDENT_AMBULATORY_CARE_PROVIDER_SITE_OTHER): Payer: Self-pay | Admitting: Orthopaedic Surgery

## 2017-10-22 VITALS — BP 156/90 | HR 87 | Ht 65.0 in | Wt 218.0 lb

## 2017-10-22 DIAGNOSIS — M25511 Pain in right shoulder: Secondary | ICD-10-CM

## 2017-10-22 DIAGNOSIS — G8929 Other chronic pain: Secondary | ICD-10-CM

## 2017-10-22 NOTE — Progress Notes (Signed)
   Office Visit Note   Patient: Debbie Bradshaw           Date of Birth: Jan 15, 1966           MRN: [id number] Visit Date: 10/22/2017              Requested by: [facility] [address] [place], [place] [address] PCP: [facility]   Assessment & Plan: Visit Diagnoses:  1. Chronic right shoulder pain     Plan: Doing well 10 weeks status post repeat rotator cuff tear repair right shoulder we will plan to see back as needed.  No longer needs sling continue with exercises.  Careful with activities  Follow-Up Instructions: Return if symptoms worsen or fail to improve.   Orders:  No orders of the defined types were placed in this encounter.  No orders of the defined types were placed in this encounter.     Procedures: No procedures performed   Clinical Data: No additional findings.   Subjective: Chief Complaint  Patient presents with  . Follow-up    right arm surgery doing ok having some soreness done PT    HPI  Review of Systems  Constitutional: Negative for fatigue and fever.  HENT: Negative for ear pain.   Eyes: Negative for pain.  Respiratory: Negative for cough and shortness of breath.   Cardiovascular: Negative for leg swelling.  Gastrointestinal: Negative for constipation and diarrhea.  Genitourinary: Negative for difficulty urinating.  Musculoskeletal: Positive for back pain and neck pain.  Skin: Negative for rash.  Allergic/Immunologic: Negative for food allergies.  Neurological: Negative for weakness and numbness.  Hematological: Bruises/bleeds easily.  Psychiatric/Behavioral: Negative for sleep disturbance.     Objective: Vital Signs: BP (!) 156/90 (BP Location: Left Arm, Patient Position: Sitting, Cuff Size: Normal)   Pulse 87   Ht 5\' 5"  (1.651 m)   Wt 218 lb (98.9 kg)   BMI 36.28 kg/m   Physical Exam  Ortho Exam awake alert and oriented x3.  Comfortable sitting.  Able to place her right arm quickly over  her head.  No significant impingement.  Skin intact biceps intact.  Has new tattoo along the arm in remembrance of her neice who recently died.  No localized areas of tenderness.  Specialty Comments:  No specialty comments available.  Imaging: No results found.   PMFS History: Patient Active Problem List   Diagnosis Date Noted  . Biceps tendonitis on right 11/05/2016   Past Medical History:  Diagnosis Date  . Hypertension     Family History  Problem Relation Age of Onset  . Heart attack Mother   . Cancer Father   . Crohn's disease Father   . Osteoporosis Father   . Diabetes Father     Past Surgical History:  Procedure Laterality Date  . CESAREAN SECTION    . SHOULDER ARTHROSCOPY  2018  . TUBAL LIGATION     Social History   Occupational History  . Not on file  Tobacco Use  . Smoking status: Current Some Day Smoker    Packs/day: 0.10    Years: 35.00    Pack years: 3.50    Types: Cigarettes  . Smokeless tobacco: Never Used  Substance and Sexual Activity  . Alcohol use: No  . Drug use: No  . Sexual activity: Yes    Birth control/protection: Surgical

## 2017-10-26 DIAGNOSIS — M5416 Radiculopathy, lumbar region: Secondary | ICD-10-CM | POA: Diagnosis not present

## 2017-10-26 DIAGNOSIS — F418 Other specified anxiety disorders: Secondary | ICD-10-CM | POA: Diagnosis not present

## 2017-10-26 DIAGNOSIS — Z6841 Body Mass Index (BMI) 40.0 and over, adult: Secondary | ICD-10-CM | POA: Diagnosis not present

## 2017-11-01 ENCOUNTER — Telehealth (INDEPENDENT_AMBULATORY_CARE_PROVIDER_SITE_OTHER): Payer: Self-pay | Admitting: Orthopaedic Surgery

## 2017-11-01 NOTE — Telephone Encounter (Signed)
Please advise. Last RF 10/14/17 15 pills

## 2017-11-01 NOTE — Telephone Encounter (Signed)
Patient called requesting prescription refill of Hydrocodone to be [facility] to [facility] [address] [facility]

## 2017-11-01 NOTE — Telephone Encounter (Signed)
I calledl patient

## 2017-11-01 NOTE — Telephone Encounter (Signed)
Wait 2 weeks 

## 2017-11-02 DIAGNOSIS — M25511 Pain in right shoulder: Secondary | ICD-10-CM | POA: Diagnosis not present

## 2017-11-02 DIAGNOSIS — M25611 Stiffness of right shoulder, not elsewhere classified: Secondary | ICD-10-CM | POA: Diagnosis not present

## 2017-12-10 DIAGNOSIS — I10 Essential (primary) hypertension: Secondary | ICD-10-CM | POA: Diagnosis not present

## 2017-12-10 DIAGNOSIS — Z0000 Encounter for general adult medical examination without abnormal findings: Secondary | ICD-10-CM | POA: Diagnosis not present

## 2017-12-17 DIAGNOSIS — Z8249 Family history of ischemic heart disease and other diseases of the circulatory system: Secondary | ICD-10-CM | POA: Diagnosis not present

## 2017-12-17 DIAGNOSIS — Z0000 Encounter for general adult medical examination without abnormal findings: Secondary | ICD-10-CM | POA: Diagnosis not present

## 2017-12-17 DIAGNOSIS — R7309 Other abnormal glucose: Secondary | ICD-10-CM | POA: Diagnosis not present

## 2017-12-17 DIAGNOSIS — I10 Essential (primary) hypertension: Secondary | ICD-10-CM | POA: Diagnosis not present

## 2017-12-17 DIAGNOSIS — Z1389 Encounter for screening for other disorder: Secondary | ICD-10-CM | POA: Diagnosis not present

## 2018-01-25 DIAGNOSIS — Z1231 Encounter for screening mammogram for malignant neoplasm of breast: Secondary | ICD-10-CM | POA: Diagnosis not present

## 2018-01-25 DIAGNOSIS — Z01419 Encounter for gynecological examination (general) (routine) without abnormal findings: Secondary | ICD-10-CM | POA: Diagnosis not present

## 2018-01-25 DIAGNOSIS — Z130 Encounter for screening for diseases of the blood and blood-forming organs and certain disorders involving the immune mechanism: Secondary | ICD-10-CM | POA: Diagnosis not present

## 2018-01-25 DIAGNOSIS — Z124 Encounter for screening for malignant neoplasm of cervix: Secondary | ICD-10-CM | POA: Diagnosis not present

## 2018-01-25 DIAGNOSIS — Z6836 Body mass index (BMI) 36.0-36.9, adult: Secondary | ICD-10-CM | POA: Diagnosis not present

## 2018-01-25 DIAGNOSIS — Z1151 Encounter for screening for human papillomavirus (HPV): Secondary | ICD-10-CM | POA: Diagnosis not present

## 2018-02-14 ENCOUNTER — Telehealth: Payer: Self-pay | Admitting: Neurology

## 2018-02-14 DIAGNOSIS — G4761 Periodic limb movement disorder: Secondary | ICD-10-CM

## 2018-02-14 DIAGNOSIS — G4762 Sleep related leg cramps: Secondary | ICD-10-CM

## 2018-02-14 DIAGNOSIS — G2581 Restless legs syndrome: Secondary | ICD-10-CM

## 2018-02-14 DIAGNOSIS — E669 Obesity, unspecified: Secondary | ICD-10-CM

## 2018-02-14 DIAGNOSIS — R0683 Snoring: Secondary | ICD-10-CM

## 2018-02-14 NOTE — Telephone Encounter (Signed)
[facility] sent over a fax checking on the status of the patient's sleep study. When I went in to investigate I seen there was no order put in for the pt to have a sleep study. Can you please put a order in if we need to get the patient scheduled for a sleep study? Thank you.

## 2018-02-14 NOTE — Telephone Encounter (Signed)
I must have not ordered sleep study at the time of visit. Please try to expedite.

## 2018-02-14 NOTE — Addendum Note (Signed)
Addended by: Huston Foley on: 02/14/2018 04:21 PM   Modules accepted: Orders

## 2018-02-17 ENCOUNTER — Telehealth: Payer: Self-pay | Admitting: Neurology

## 2018-02-17 NOTE — Telephone Encounter (Signed)
Pt not interested in completing a sleep study.

## 2018-03-30 DIAGNOSIS — I10 Essential (primary) hypertension: Secondary | ICD-10-CM | POA: Diagnosis not present

## 2018-03-30 DIAGNOSIS — F331 Major depressive disorder, recurrent, moderate: Secondary | ICD-10-CM | POA: Diagnosis not present

## 2018-03-30 DIAGNOSIS — M1990 Unspecified osteoarthritis, unspecified site: Secondary | ICD-10-CM | POA: Diagnosis not present

## 2018-03-30 DIAGNOSIS — F418 Other specified anxiety disorders: Secondary | ICD-10-CM | POA: Diagnosis not present

## 2018-12-19 DIAGNOSIS — Z0000 Encounter for general adult medical examination without abnormal findings: Secondary | ICD-10-CM | POA: Diagnosis not present

## 2018-12-19 DIAGNOSIS — R7303 Prediabetes: Secondary | ICD-10-CM | POA: Diagnosis not present

## 2018-12-19 DIAGNOSIS — E785 Hyperlipidemia, unspecified: Secondary | ICD-10-CM | POA: Diagnosis not present

## 2018-12-28 ENCOUNTER — Other Ambulatory Visit: Payer: Self-pay

## 2018-12-28 DIAGNOSIS — Z1331 Encounter for screening for depression: Secondary | ICD-10-CM | POA: Diagnosis not present

## 2018-12-28 DIAGNOSIS — Z0000 Encounter for general adult medical examination without abnormal findings: Secondary | ICD-10-CM | POA: Diagnosis not present

## 2018-12-28 DIAGNOSIS — G43909 Migraine, unspecified, not intractable, without status migrainosus: Secondary | ICD-10-CM | POA: Diagnosis not present

## 2018-12-28 DIAGNOSIS — G2581 Restless legs syndrome: Secondary | ICD-10-CM | POA: Diagnosis not present

## 2018-12-28 DIAGNOSIS — Z20822 Contact with and (suspected) exposure to covid-19: Secondary | ICD-10-CM

## 2018-12-28 DIAGNOSIS — F331 Major depressive disorder, recurrent, moderate: Secondary | ICD-10-CM | POA: Diagnosis not present

## 2018-12-30 LAB — NOVEL CORONAVIRUS, NAA: SARS-CoV-2, NAA: NOT DETECTED

## 2019-02-22 ENCOUNTER — Other Ambulatory Visit: Payer: Self-pay | Admitting: Obstetrics and Gynecology

## 2019-02-22 DIAGNOSIS — N6320 Unspecified lump in the left breast, unspecified quadrant: Secondary | ICD-10-CM

## 2019-03-06 ENCOUNTER — Other Ambulatory Visit: Payer: Self-pay

## 2019-03-06 ENCOUNTER — Ambulatory Visit
Admission: RE | Admit: 2019-03-06 | Discharge: 2019-03-06 | Disposition: A | Discharge status: Home / Self Care | Payer: BLUE CROSS/BLUE SHIELD | Source: Ambulatory Visit | Attending: Obstetrics and Gynecology | Admitting: Obstetrics and Gynecology

## 2019-03-06 ENCOUNTER — Ambulatory Visit
Admission: RE | Admit: 2019-03-06 | Discharge: 2019-03-06 | Disposition: A | Discharge status: Home / Self Care | Payer: 59 | Source: Ambulatory Visit | Attending: Obstetrics and Gynecology | Admitting: Obstetrics and Gynecology

## 2019-03-06 DIAGNOSIS — N6320 Unspecified lump in the left breast, unspecified quadrant: Secondary | ICD-10-CM

## 2019-08-02 ENCOUNTER — Encounter: Payer: Self-pay | Admitting: Orthopaedic Surgery

## 2019-08-02 ENCOUNTER — Other Ambulatory Visit: Payer: Self-pay

## 2019-08-02 ENCOUNTER — Ambulatory Visit (INDEPENDENT_AMBULATORY_CARE_PROVIDER_SITE_OTHER): Payer: 59 | Admitting: Orthopaedic Surgery

## 2019-08-02 ENCOUNTER — Ambulatory Visit (INDEPENDENT_AMBULATORY_CARE_PROVIDER_SITE_OTHER): Payer: 59

## 2019-08-02 VITALS — Ht 65.0 in | Wt 198.0 lb

## 2019-08-02 DIAGNOSIS — G8929 Other chronic pain: Secondary | ICD-10-CM

## 2019-08-02 DIAGNOSIS — M25562 Pain in left knee: Secondary | ICD-10-CM | POA: Diagnosis not present

## 2019-08-02 DIAGNOSIS — M1712 Unilateral primary osteoarthritis, left knee: Secondary | ICD-10-CM | POA: Diagnosis not present

## 2019-08-02 MED ORDER — METHYLPREDNISOLONE ACETATE 40 MG/ML IJ SUSP
80.0000 mg | INTRAMUSCULAR | Status: AC | PRN
  Administered 2019-08-02: 80 mg via INTRA_ARTICULAR

## 2019-08-02 MED ORDER — BUPIVACAINE HCL 0.5 % IJ SOLN
2.0000 mL | INTRAMUSCULAR | Status: AC | PRN
  Administered 2019-08-02: 2 mL via INTRA_ARTICULAR

## 2019-08-02 MED ORDER — LIDOCAINE HCL 1 % IJ SOLN
2.0000 mL | INTRAMUSCULAR | Status: AC | PRN
  Administered 2019-08-02: 2 mL

## 2019-08-02 NOTE — Progress Notes (Signed)
Office Visit Note   Patient: Debbie Bradshaw           Date of Birth: 05/26/1965           MRN: [id number] Visit Date: 08/02/2019              Requested by: Alysia Penna, MD [address] [place],  [place] [address] PCP: Alysia Penna, MD   Assessment & Plan: Visit Diagnoses:  1. Chronic pain of left knee   2. Unilateral primary osteoarthritis, left knee     Plan: Primary osteoarthritis left knee.  Films demonstrate significant medial joint narrowing with subchondral sclerosis and peripheral osteophytes as well as some irregularity about the patella. Long discussion regarding the diagnosis and treatment options.  This also included knee replacement.  I do not think she is at that point but I do think it is worth injected the knee with cortisone and monitoring her  response.  She might be a candidate for viscosupplementation.  She will let me know how she did with the injection  Follow-Up Instructions: Return if symptoms worsen or fail to improve.   Orders:  Orders Placed This Encounter  Procedures  . Large Joint Inj: L knee  . XR KNEE 3 VIEW LEFT   No orders of the defined types were placed in this encounter.     Procedures: Large Joint Inj: L knee on 08/02/2019 3:03 PM Indications: pain and diagnostic evaluation Details: 25 G 1.5 in needle, anteromedial approach  Arthrogram: No  Medications: 2 mL lidocaine 1 %; 2 mL bupivacaine 0.5 %; 80 mg methylPREDNISolone acetate 40 MG/ML Procedure, treatment alternatives, risks and benefits explained, specific risks discussed. Consent was given by the patient. Patient was prepped and draped in the usual sterile fashion.       Clinical Data: No additional findings.   Subjective: Chief Complaint  Patient presents with  . Left Knee - Pain  Patient presents today for left knee pain. No known injury. She said that it has hurt for a year, but worsening recently. Her pain is located anteriorly, along with swelling. She  said that at times it aches all the way down to her feet. She states that it grinds and gives way. She PCP gave her Naproxen to take, but states that it does not seem to help. No previous treatment.   HPI  Review of Systems   Objective: Vital Signs: Ht 5\' 5"  (1.651 m)   Wt 198 lb (89.8 kg)   BMI 32.95 kg/m   Physical Exam Constitutional:      Appearance: She is well-developed.  Eyes:     Pupils: Pupils are equal, round, and reactive to light.  Pulmonary:     Effort: Pulmonary effort is normal.  Skin:    General: Skin is warm and dry.  Neurological:     Mental Status: She is alert and oriented to person, place, and time.  Psychiatric:        Behavior: Behavior normal.     Ortho Exam awake alert and oriented x3.  Comfortable sitting.  Exam in the left knee reveals no evidence of effusion but there is diffuse medial joint tenderness and patella crepitation.  Full extension and flexion about 105 degrees with without instability.  No calf pain.  No popliteal pain or mass.  Good pulses distally.  Straight leg raise negative.  Painless range of motion both hips  Specialty Comments:  No specialty comments available.  Imaging: XR KNEE 3 VIEW LEFT  Result Date: 08/02/2019 Films of the left knee were obtained in 3 projections standing.  There is evidence of irregularity beneath the patella consistent with arthritis.  There is some narrowing of the medial joint space with small peripheral osteophytes and subchondral sclerosis.  Films are consistent with moderate to advanced osteoarthritis.  No acute changes or ectopic calcification    PMFS History: Patient Active Problem List   Diagnosis Date Noted  . Unilateral primary osteoarthritis, left knee 08/02/2019  . Biceps tendonitis on right 11/05/2016   Past Medical History:  Diagnosis Date  . Hypertension     Family History  Problem Relation Age of Onset  . Heart attack Mother   . Cancer Father   . Crohn's disease Father   .  Osteoporosis Father   . Diabetes Father     Past Surgical History:  Procedure Laterality Date  . CESAREAN SECTION    . SHOULDER ARTHROSCOPY  2018  . TUBAL LIGATION     Social History   Occupational History  . Not on file  Tobacco Use  . Smoking status: Current Some Day Smoker    Packs/day: 0.10    Years: 35.00    Pack years: 3.50    Types: Cigarettes  . Smokeless tobacco: Never Used  Vaping Use  . Vaping Use: Never used  Substance and Sexual Activity  . Alcohol use: No  . Drug use: No  . Sexual activity: Yes    Birth control/protection: Surgical

## 2020-02-27 ENCOUNTER — Telehealth: Payer: Self-pay

## 2020-02-27 NOTE — Telephone Encounter (Signed)
Pt called into the office asking if she could have something called in for her knee pain

## 2020-02-27 NOTE — Telephone Encounter (Signed)
According to my notes Debbie Bradshaw hasnot been seen since July-needs to be re-evaluated. Also I do not routinely prescribe narcotics for OA unless surgery is imminent-thanks

## 2020-02-27 NOTE — Telephone Encounter (Signed)
Called patient. No answer. Left message with information below. 

## 2020-03-07 ENCOUNTER — Telehealth: Payer: Self-pay | Admitting: Orthopaedic Surgery

## 2020-03-07 NOTE — Telephone Encounter (Signed)
Patient called requesting a call back from Lauren G. Patient wouldn't disclose the reasoning for her call. Please call patient at [phone].

## 2020-03-07 NOTE — Telephone Encounter (Signed)
Called and spoke with patient. Scheduled appointment

## 2020-03-21 ENCOUNTER — Encounter: Payer: Self-pay | Admitting: Orthopaedic Surgery

## 2020-03-21 ENCOUNTER — Ambulatory Visit: Payer: Self-pay

## 2020-03-21 ENCOUNTER — Ambulatory Visit (INDEPENDENT_AMBULATORY_CARE_PROVIDER_SITE_OTHER): Payer: 59 | Admitting: Orthopaedic Surgery

## 2020-03-21 DIAGNOSIS — M25511 Pain in right shoulder: Secondary | ICD-10-CM

## 2020-03-21 DIAGNOSIS — G8929 Other chronic pain: Secondary | ICD-10-CM | POA: Diagnosis not present

## 2020-03-21 DIAGNOSIS — M25561 Pain in right knee: Secondary | ICD-10-CM | POA: Diagnosis not present

## 2020-03-21 DIAGNOSIS — M25512 Pain in left shoulder: Secondary | ICD-10-CM

## 2020-03-21 DIAGNOSIS — M170 Bilateral primary osteoarthritis of knee: Secondary | ICD-10-CM | POA: Insufficient documentation

## 2020-03-21 MED ORDER — BUPIVACAINE HCL 0.25 % IJ SOLN
2.0000 mL | INTRAMUSCULAR | Status: AC | PRN
  Administered 2020-03-21: 2 mL via INTRA_ARTICULAR

## 2020-03-21 NOTE — Progress Notes (Signed)
Office Visit Note   Patient: Debbie Bradshaw           Date of Birth: [birth year]           MRN: [id number] Visit Date: 03/21/2020              Requested by: Alysia Penna, MD [address] [place],  [place] [address] PCP: Alysia Penna, MD   Assessment & Plan: Visit Diagnoses:  1. Chronic pain of right knee   2. Chronic pain of both shoulders   3. Bilateral primary osteoarthritis of knee     Plan: Osteoarthritis both knees.  Right knee is more symptomatic.  Will inject with cortisone and monitor response.  Working on a daily basis and not in a situation to consider knee replacement.  Would like to repeat films of her right knee at some point in the future if she continues to have a problem.  Might be a candidate for viscosupplementation.  Also having some recurrent right shoulder pain.  She has had rotator cuff tear repair twice the last was in 2019.  No injury or trauma.  Would reevaluate in 2 weeks and consider subacromial cortisone injection and films  Follow-Up Instructions: Return in about 2 weeks (around 04/04/2020).   Orders:  Orders Placed This Encounter  Procedures  . Large Joint Inj: R knee   No orders of the defined types were placed in this encounter.     Procedures: Large Joint Inj: R knee on 03/21/2020 3:06 PM Indications: pain and diagnostic evaluation Details: 25 G 1.5 in needle, anteromedial approach  Arthrogram: No  Medications: 2 mL bupivacaine 0.25 %  12 mg betamethasone injected the medial compartment right knee with the Marcaine Procedure, treatment alternatives, risks and benefits explained, specific risks discussed. Consent was given by the patient. Immediately prior to procedure a time out was called to verify the correct patient, procedure, equipment, support staff and site/side marked as required. Patient was prepped and draped in the usual sterile fashion.       Clinical Data: No additional findings.   Subjective: Chief  Complaint  Patient presents with  . Right Knee - Pain  . Left Knee - Pain  . Right Shoulder - Pain  . Left Shoulder - Pain  With the recent changes in weather i.e. cold and rain Debbie Bradshaw has had some recurrent pain in both of her knees.  She had films of the left knee in 2021 consistent with arthritis.  I suspect she is got the same in her right knee.  Is been no injury or trauma.  She like to have a cortisone injection.  Also experiencing some discomfort in her right shoulder without recurrent injury or trauma.  She has had rotator cuff tear repair twice in the right shoulder.  The last was in 2019.  Having some discomfort with overhead activity little bit of stiffness with internal and external rotation  HPI  Review of Systems   Objective: Vital Signs: There were no vitals taken for this visit.  Physical Exam Constitutional:      Appearance: She is well-developed and well-nourished.  HENT:     Mouth/Throat:     Mouth: Oropharynx is clear and moist.  Eyes:     Extraocular Movements: EOM normal.     Pupils: Pupils are equal, round, and reactive to light.  Pulmonary:     Effort: Pulmonary effort is normal.  Skin:    General: Skin is warm and dry.  Neurological:  Mental Status: She is alert and oriented to person, place, and time.  Psychiatric:        Mood and Affect: Mood and affect normal.        Behavior: Behavior normal.     Ortho Exam right knee with mild medial joint pain.  No effusion.  No instability.  Full extension and flex to probably 105 degrees.  No popliteal pain or mass or distal edema.  Painless range of motion of both hips.  Specialty Comments:  No specialty comments available.  Imaging: No results found.   PMFS History: Patient Active Problem List   Diagnosis Date Noted  . Bilateral primary osteoarthritis of knee 03/21/2020  . Unilateral primary osteoarthritis, left knee 08/02/2019  . Biceps tendonitis on right 11/05/2016   Past Medical  History:  Diagnosis Date  . Hypertension     Family History  Problem Relation Age of Onset  . Heart attack Mother   . Cancer Father   . Crohn's disease Father   . Osteoporosis Father   . Diabetes Father     Past Surgical History:  Procedure Laterality Date  . CESAREAN SECTION    . SHOULDER ARTHROSCOPY  2018  . TUBAL LIGATION     Social History   Occupational History  . Not on file  Tobacco Use  . Smoking status: Current Some Day Smoker    Packs/day: 0.10    Years: 35.00    Pack years: 3.50    Types: Cigarettes  . Smokeless tobacco: Never Used  Vaping Use  . Vaping Use: Never used  Substance and Sexual Activity  . Alcohol use: No  . Drug use: No  . Sexual activity: Yes    Birth control/protection: Surgical

## 2020-04-04 ENCOUNTER — Ambulatory Visit (INDEPENDENT_AMBULATORY_CARE_PROVIDER_SITE_OTHER): Payer: 59 | Admitting: Orthopaedic Surgery

## 2020-04-04 ENCOUNTER — Encounter: Payer: Self-pay | Admitting: Orthopaedic Surgery

## 2020-04-04 ENCOUNTER — Ambulatory Visit (INDEPENDENT_AMBULATORY_CARE_PROVIDER_SITE_OTHER): Payer: 59

## 2020-04-04 ENCOUNTER — Other Ambulatory Visit: Payer: Self-pay

## 2020-04-04 VITALS — Ht 65.0 in | Wt 198.0 lb

## 2020-04-04 DIAGNOSIS — M1712 Unilateral primary osteoarthritis, left knee: Secondary | ICD-10-CM | POA: Diagnosis not present

## 2020-04-04 DIAGNOSIS — M25511 Pain in right shoulder: Secondary | ICD-10-CM | POA: Diagnosis not present

## 2020-04-04 DIAGNOSIS — G8929 Other chronic pain: Secondary | ICD-10-CM | POA: Diagnosis not present

## 2020-04-04 MED ORDER — LIDOCAINE HCL 2 % IJ SOLN
2.0000 mL | INTRAMUSCULAR | Status: AC | PRN
  Administered 2020-04-04: 2 mL

## 2020-04-04 MED ORDER — BUPIVACAINE HCL 0.25 % IJ SOLN
2.0000 mL | INTRAMUSCULAR | Status: AC | PRN
  Administered 2020-04-04: 2 mL via INTRA_ARTICULAR

## 2020-04-04 NOTE — Progress Notes (Signed)
Office Visit Note   Patient: Debbie Bradshaw           Date of Birth: 1965/05/17           MRN: [id number] Visit Date: 04/04/2020              Requested by: Alysia Penna, MD [address] [place],  [place] [address] PCP: Alysia Penna, MD   Assessment & Plan: Visit Diagnoses:  Chronic right shoulder pain.  Will inject subacromial space with betamethasone and monitor response.  Has had prior rotator cuff tear repair several years ago with good relief of her pain.  He is involved in activity with recurrent overhead lifting.  No specific injury or trauma.  X-rays today revealed minimal elevation of the humeral head in relation to the glenoid but no acute change  Plan:   Follow-Up Instructions: Return if symptoms worsen or fail to improve.   Orders:  Orders Placed This Encounter  Procedures  . Large Joint Inj: R subacromial bursa  . XR Shoulder Right   No orders of the defined types were placed in this encounter.     Procedures: Large Joint Inj: R subacromial bursa on 04/04/2020 3:57 PM Indications: pain and diagnostic evaluation Details: 25 G 1.5 in needle, anterolateral approach  Arthrogram: No  Medications: 2 mL lidocaine 2 %; 2 mL bupivacaine 0.25 %  12 mg betamethasone injected in the subacromial space right shoulder with Marcaine and Xylocaine Consent was given by the patient. Immediately prior to procedure a time out was called to verify the correct patient, procedure, equipment, support staff and site/side marked as required. Patient was prepped and draped in the usual sterile fashion.       Clinical Data: No additional findings.   Subjective: Chief Complaint  Patient presents with  . Right Shoulder - Pain  . Right Knee - Follow-up  Patient presents today for right shoulder pain. She states that it has been hurting for a year. She said that the pain is worsening. She has pain with motion. She cannot lift a 3 gallon bucket out of her truck. No  numbness or tingling. She is right hand dominant. She takes Naproxen for pain. She has a history of shoulder surgery in the past with Dr.Whtifield.  She is also following up for her right knee. She received a cortisone injection two weeks ago. She states that her right knee is feeling better.   HPI  Review of Systems   Objective: Vital Signs: Ht 5\' 5"  (1.651 m)   Wt 198 lb (89.8 kg)   BMI 32.95 kg/m   Physical Exam Constitutional:      Appearance: She is well-developed.  Eyes:     Pupils: Pupils are equal, round, and reactive to light.  Pulmonary:     Effort: Pulmonary effort is normal.  Skin:    General: Skin is warm and dry.  Neurological:     Mental Status: She is alert and oriented to person, place, and time.  Psychiatric:        Behavior: Behavior normal.     Ortho Exam right shoulder with positive impingement on the extreme of external rotation.  Appears to have good strength with internal rotation but some weakness with external rotation.  There is some local tenderness along the anterior subacromial region.  Biceps appears to be intact.  Skin intact.  No pain at the [facility] joint.  Minimally positive empty can testing Specialty Comments:  No specialty comments available.  Imaging:  XR Shoulder Right  Result Date: 04/04/2020 Films of the right shoulder obtained in numerous projections.  There may be slight superior migration of the humeral head relationship to the glenoid.  Normal space between the humeral head and the acromion.  Prior distal clavicle resection without complication.  No ectopic calcification.  A single metallic anchor in the humeral head from prior rotator cuff tear repair    PMFS History: Patient Active Problem List   Diagnosis Date Noted  . Pain in right shoulder 04/04/2020  . Bilateral primary osteoarthritis of knee 03/21/2020  . Unilateral primary osteoarthritis, left knee 08/02/2019  . Biceps tendonitis on right 11/05/2016   Past Medical  History:  Diagnosis Date  . Hypertension     Family History  Problem Relation Age of Onset  . Heart attack Mother   . Cancer Father   . Crohn's disease Father   . Osteoporosis Father   . Diabetes Father     Past Surgical History:  Procedure Laterality Date  . CESAREAN SECTION    . SHOULDER ARTHROSCOPY  2018  . TUBAL LIGATION     Social History   Occupational History  . Not on file  Tobacco Use  . Smoking status: Current Some Day Smoker    Packs/day: 0.10    Years: 35.00    Pack years: 3.50    Types: Cigarettes  . Smokeless tobacco: Never Used  Vaping Use  . Vaping Use: Never used  Substance and Sexual Activity  . Alcohol use: No  . Drug use: No  . Sexual activity: Yes    Birth control/protection: Surgical

## 2021-02-12 ENCOUNTER — Other Ambulatory Visit: Payer: Self-pay | Admitting: Internal Medicine

## 2021-02-12 DIAGNOSIS — F17200 Nicotine dependence, unspecified, uncomplicated: Secondary | ICD-10-CM

## 2021-02-24 ENCOUNTER — Other Ambulatory Visit: Payer: Self-pay | Admitting: Obstetrics and Gynecology

## 2021-02-24 DIAGNOSIS — Z1231 Encounter for screening mammogram for malignant neoplasm of breast: Secondary | ICD-10-CM

## 2021-03-07 ENCOUNTER — Ambulatory Visit: Payer: Self-pay

## 2021-06-17 ENCOUNTER — Other Ambulatory Visit: Payer: Self-pay | Admitting: Internal Medicine

## 2021-06-17 DIAGNOSIS — E785 Hyperlipidemia, unspecified: Secondary | ICD-10-CM

## 2022-02-18 ENCOUNTER — Other Ambulatory Visit: Payer: Self-pay | Admitting: Internal Medicine

## 2022-02-18 DIAGNOSIS — Z0000 Encounter for general adult medical examination without abnormal findings: Secondary | ICD-10-CM

## 2022-02-18 DIAGNOSIS — F17200 Nicotine dependence, unspecified, uncomplicated: Secondary | ICD-10-CM

## 2022-03-18 ENCOUNTER — Ambulatory Visit
Admission: RE | Admit: 2022-03-18 | Discharge: 2022-03-18 | Disposition: A | Discharge status: Home / Self Care | Payer: Commercial Managed Care - HMO | Source: Ambulatory Visit | Attending: Internal Medicine | Admitting: Internal Medicine

## 2022-03-18 DIAGNOSIS — F17200 Nicotine dependence, unspecified, uncomplicated: Secondary | ICD-10-CM

## 2022-03-18 DIAGNOSIS — Z0000 Encounter for general adult medical examination without abnormal findings: Secondary | ICD-10-CM

## 2023-02-23 ENCOUNTER — Other Ambulatory Visit: Payer: Self-pay | Admitting: Internal Medicine

## 2023-02-23 DIAGNOSIS — Z0000 Encounter for general adult medical examination without abnormal findings: Secondary | ICD-10-CM

## 2023-02-23 DIAGNOSIS — F17200 Nicotine dependence, unspecified, uncomplicated: Secondary | ICD-10-CM

## 2023-03-19 DIAGNOSIS — H35362 Drusen (degenerative) of macula, left eye: Secondary | ICD-10-CM | POA: Diagnosis not present

## 2023-03-19 DIAGNOSIS — H5203 Hypermetropia, bilateral: Secondary | ICD-10-CM | POA: Diagnosis not present

## 2023-03-19 DIAGNOSIS — H2513 Age-related nuclear cataract, bilateral: Secondary | ICD-10-CM | POA: Diagnosis not present

## 2023-03-19 DIAGNOSIS — H524 Presbyopia: Secondary | ICD-10-CM | POA: Diagnosis not present

## 2023-03-19 DIAGNOSIS — H04123 Dry eye syndrome of bilateral lacrimal glands: Secondary | ICD-10-CM | POA: Diagnosis not present

## 2023-03-19 DIAGNOSIS — H52223 Regular astigmatism, bilateral: Secondary | ICD-10-CM | POA: Diagnosis not present

## 2023-08-11 DIAGNOSIS — M5136 Other intervertebral disc degeneration, lumbar region with discogenic back pain only: Secondary | ICD-10-CM | POA: Diagnosis not present

## 2023-08-24 DIAGNOSIS — M5416 Radiculopathy, lumbar region: Secondary | ICD-10-CM | POA: Diagnosis not present

## 2023-08-24 DIAGNOSIS — M810 Age-related osteoporosis without current pathological fracture: Secondary | ICD-10-CM | POA: Diagnosis not present

## 2023-08-24 DIAGNOSIS — Z780 Asymptomatic menopausal state: Secondary | ICD-10-CM | POA: Diagnosis not present

## 2023-09-10 DIAGNOSIS — M545 Low back pain, unspecified: Secondary | ICD-10-CM | POA: Diagnosis not present

## 2023-09-21 DIAGNOSIS — M5416 Radiculopathy, lumbar region: Secondary | ICD-10-CM | POA: Diagnosis not present

## 2023-12-20 ENCOUNTER — Ambulatory Visit: Admitting: Podiatry

## 2023-12-20 ENCOUNTER — Encounter: Payer: Self-pay | Admitting: Podiatry

## 2023-12-20 VITALS — Ht 65.0 in | Wt 198.0 lb

## 2023-12-20 DIAGNOSIS — L600 Ingrowing nail: Secondary | ICD-10-CM

## 2023-12-20 NOTE — Patient Instructions (Signed)

## 2023-12-20 NOTE — Progress Notes (Signed)
   Chief Complaint  Patient presents with   Ingrown Toenail    Pt is here due to possible ingrown to the left great toenail, she states the nail is curved in and is causing some pain, she cannot wear, closed toed shoe due to this.    Subjective: Patient presents today for evaluation of pain to the left great toenail. Patient is concerned for possible ingrown nail.  It is very sensitive to touch.  It is incurvated and she has a history of trauma to the toenail on multiple occasions over the past several years.  The patient is hoping to have the nail permanently removed today   Past Medical History:  Diagnosis Date   Hypertension     Past Surgical History:  Procedure Laterality Date   CESAREAN SECTION     SHOULDER ARTHROSCOPY  2018   TUBAL LIGATION      No Known Allergies  Objective:  General: Well developed, nourished, in no acute distress, alert and oriented x3   Dermatology: Skin is warm, dry and supple bilateral.  Medial and lateral border of the left great toe is tender with evidence of an ingrowing nail with pincer nail deformity. Pain on palpation noted to the border of the nail fold. The remaining nails appear unremarkable at this time.   Vascular: DP and PT pulses palpable.  No clinical evidence of vascular compromise  Neruologic: Grossly intact via light touch bilateral.  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  -Discussed treatment alternatives and plan of care. Explained nail avulsion procedure and post procedure course to patient. -Patient opted for permanent total nail avulsion of the ingrown portion of the nail.  -Prior to procedure, local anesthesia infiltration utilized using 3 ml of a 50:50 mixture of 2% plain lidocaine  and 0.5% plain marcaine  in a normal hallux block fashion and a betadine prep performed.  - Total permanent nail  avulsion with chemical matrixectomy performed using 3x30sec applications of phenol followed by alcohol  flush.  -Light dressing applied.  Post care instructions provided -Return to clinic 3 weeks  Thresa EMERSON Sar, DPM [facility]  Dr. Thresa EMERSON Sar, DPM    [address] [place], [place] [address]                Office [phone]  Fax [phone]

## 2023-12-22 ENCOUNTER — Ambulatory Visit: Admitting: Podiatry

## 2024-01-06 DIAGNOSIS — Z1389 Encounter for screening for other disorder: Secondary | ICD-10-CM | POA: Diagnosis not present

## 2024-01-06 DIAGNOSIS — Z01419 Encounter for gynecological examination (general) (routine) without abnormal findings: Secondary | ICD-10-CM | POA: Diagnosis not present

## 2024-01-06 DIAGNOSIS — Z130 Encounter for screening for diseases of the blood and blood-forming organs and certain disorders involving the immune mechanism: Secondary | ICD-10-CM | POA: Diagnosis not present

## 2024-01-06 DIAGNOSIS — Z1151 Encounter for screening for human papillomavirus (HPV): Secondary | ICD-10-CM | POA: Diagnosis not present

## 2024-01-06 DIAGNOSIS — Z124 Encounter for screening for malignant neoplasm of cervix: Secondary | ICD-10-CM | POA: Diagnosis not present

## 2024-01-06 DIAGNOSIS — Z1231 Encounter for screening mammogram for malignant neoplasm of breast: Secondary | ICD-10-CM | POA: Diagnosis not present

## 2024-01-17 ENCOUNTER — Encounter: Payer: Self-pay | Admitting: Podiatry

## 2024-01-17 ENCOUNTER — Ambulatory Visit: Admitting: Podiatry

## 2024-01-17 VITALS — Ht 65.0 in | Wt 198.0 lb

## 2024-01-17 DIAGNOSIS — L600 Ingrowing nail: Secondary | ICD-10-CM

## 2024-01-17 NOTE — Progress Notes (Signed)
" ° °  Chief Complaint  Patient presents with   Ingrown Toenail    Pt is here to f/u on left great toenail after having ingrown removed, states still some oozing but no pain unless she hits the toe, no other complaints.    Subjective: 58 y.o. female presents today status post permanent nail avulsion procedure of the left hallux nail plate that was performed on 12/20/2023.   Past Medical History:  Diagnosis Date   Hypertension     Objective: Neurovascular status intact.  Skin is warm, dry and supple. Nail bed and nail fold appears to be healing appropriately.  No indication of infection  Assessment: #1 s/p total permanent nail avulsion left hallux nail plate.  12/20/2023   Plan of care: -Patient evaluated -The nailbed appears to be healing routinely with no indication of infection -Continue triple antibiotic and a Band-Aid daily and allow it to air out in the evenings.  I anticipate that it should resolve uneventfully over the next few weeks -Return to clinic PRN   Thresa EMERSON Sar, DPM [facility]  Dr. Thresa EMERSON Sar, DPM    [address] [place], [place] [address]                Office [phone]  Fax [phone]      "
